# Patient Record
Sex: Female | Born: 1965 | Race: White | Hispanic: No | Marital: Married | Smoking: Never smoker
Health system: Southern US, Community
[De-identification: ages and names within clinical notes are randomized; demographics above are authoritative.]

## PROBLEM LIST (undated history)

## (undated) DIAGNOSIS — D242 Benign neoplasm of left breast: Secondary | ICD-10-CM

## (undated) DIAGNOSIS — Z98811 Dental restoration status: Secondary | ICD-10-CM

## (undated) HISTORY — PX: ENDOMETRIAL ABLATION W/ NOVASURE: SUR434

---

## 1987-09-19 HISTORY — PX: GYNECOLOGIC CRYOSURGERY: SHX857

## 1998-05-12 ENCOUNTER — Other Ambulatory Visit: Admission: RE | Admit: 1998-05-12 | Discharge: 1998-05-12 | Payer: Self-pay | Admitting: Obstetrics and Gynecology

## 1999-06-16 ENCOUNTER — Other Ambulatory Visit: Admission: RE | Admit: 1999-06-16 | Discharge: 1999-06-16 | Payer: Self-pay | Admitting: Obstetrics and Gynecology

## 1999-07-26 ENCOUNTER — Encounter: Payer: Self-pay | Admitting: Obstetrics and Gynecology

## 1999-07-26 ENCOUNTER — Ambulatory Visit (HOSPITAL_COMMUNITY): Admission: RE | Admit: 1999-07-26 | Discharge: 1999-07-26 | Payer: Self-pay | Admitting: Obstetrics and Gynecology

## 2000-09-27 ENCOUNTER — Other Ambulatory Visit: Admission: RE | Admit: 2000-09-27 | Discharge: 2000-09-27 | Payer: Self-pay | Admitting: Obstetrics and Gynecology

## 2001-10-28 ENCOUNTER — Other Ambulatory Visit: Admission: RE | Admit: 2001-10-28 | Discharge: 2001-10-28 | Payer: Self-pay | Admitting: Obstetrics and Gynecology

## 2003-07-21 ENCOUNTER — Other Ambulatory Visit: Admission: RE | Admit: 2003-07-21 | Discharge: 2003-07-21 | Payer: Self-pay | Admitting: Obstetrics and Gynecology

## 2004-07-27 ENCOUNTER — Other Ambulatory Visit: Admission: RE | Admit: 2004-07-27 | Discharge: 2004-07-27 | Payer: Self-pay | Admitting: Obstetrics and Gynecology

## 2004-08-16 ENCOUNTER — Ambulatory Visit (HOSPITAL_COMMUNITY): Admission: RE | Admit: 2004-08-16 | Discharge: 2004-08-16 | Payer: Self-pay | Admitting: Obstetrics and Gynecology

## 2005-06-21 ENCOUNTER — Other Ambulatory Visit: Admission: RE | Admit: 2005-06-21 | Discharge: 2005-06-21 | Payer: Self-pay | Admitting: Obstetrics and Gynecology

## 2005-06-29 ENCOUNTER — Ambulatory Visit (HOSPITAL_COMMUNITY): Admission: RE | Admit: 2005-06-29 | Discharge: 2005-06-29 | Payer: Self-pay | Admitting: Obstetrics and Gynecology

## 2005-06-29 ENCOUNTER — Encounter (INDEPENDENT_AMBULATORY_CARE_PROVIDER_SITE_OTHER): Payer: Self-pay | Admitting: *Deleted

## 2005-06-29 HISTORY — PX: COMBINED HYSTEROSCOPY DIAGNOSTIC / D&C: SUR297

## 2005-09-25 ENCOUNTER — Ambulatory Visit (HOSPITAL_COMMUNITY): Admission: RE | Admit: 2005-09-25 | Discharge: 2005-09-25 | Payer: Self-pay | Admitting: Obstetrics and Gynecology

## 2006-09-27 ENCOUNTER — Ambulatory Visit (HOSPITAL_COMMUNITY): Admission: RE | Admit: 2006-09-27 | Discharge: 2006-09-27 | Payer: Self-pay | Admitting: Obstetrics and Gynecology

## 2007-09-30 ENCOUNTER — Ambulatory Visit (HOSPITAL_COMMUNITY): Admission: RE | Admit: 2007-09-30 | Discharge: 2007-09-30 | Payer: Self-pay | Admitting: Obstetrics and Gynecology

## 2008-01-22 ENCOUNTER — Encounter: Admission: RE | Admit: 2008-01-22 | Discharge: 2008-01-22 | Payer: Self-pay | Admitting: Obstetrics and Gynecology

## 2008-10-07 ENCOUNTER — Ambulatory Visit (HOSPITAL_COMMUNITY): Admission: RE | Admit: 2008-10-07 | Discharge: 2008-10-07 | Payer: Self-pay | Admitting: Obstetrics and Gynecology

## 2009-10-20 ENCOUNTER — Ambulatory Visit (HOSPITAL_COMMUNITY): Admission: RE | Admit: 2009-10-20 | Discharge: 2009-10-20 | Payer: Self-pay | Admitting: Obstetrics and Gynecology

## 2009-10-28 ENCOUNTER — Encounter: Admission: RE | Admit: 2009-10-28 | Discharge: 2009-10-28 | Payer: Self-pay | Admitting: Obstetrics and Gynecology

## 2010-08-26 ENCOUNTER — Encounter
Admission: RE | Admit: 2010-08-26 | Discharge: 2010-08-26 | Payer: Self-pay | Source: Home / Self Care | Attending: Obstetrics and Gynecology | Admitting: Obstetrics and Gynecology

## 2010-10-06 ENCOUNTER — Other Ambulatory Visit (HOSPITAL_COMMUNITY): Payer: Self-pay | Admitting: Obstetrics and Gynecology

## 2010-10-06 DIAGNOSIS — Z0000 Encounter for general adult medical examination without abnormal findings: Secondary | ICD-10-CM

## 2010-10-26 ENCOUNTER — Ambulatory Visit (HOSPITAL_COMMUNITY)
Admission: RE | Admit: 2010-10-26 | Discharge: 2010-10-26 | Disposition: A | Discharge status: Home / Self Care | Payer: BC Managed Care – PPO | Source: Ambulatory Visit | Attending: Obstetrics and Gynecology | Admitting: Obstetrics and Gynecology

## 2010-10-26 ENCOUNTER — Ambulatory Visit (HOSPITAL_COMMUNITY): Admission: RE | Admit: 2010-10-26 | Payer: Self-pay | Source: Home / Self Care | Admitting: Obstetrics and Gynecology

## 2010-10-26 DIAGNOSIS — Z1231 Encounter for screening mammogram for malignant neoplasm of breast: Secondary | ICD-10-CM

## 2010-10-26 DIAGNOSIS — Z0000 Encounter for general adult medical examination without abnormal findings: Secondary | ICD-10-CM

## 2011-02-03 NOTE — Op Note (Signed)
Kelsey Greer, Kelsey Greer                  ACCOUNT NO.:  [id number]   MEDICAL RECORD NO.:  [id number]          PATIENT TYPE:  AMB   LOCATION:  SDC                           FACILITY:  [facility]   PHYSICIAN:  Hal Morales, M.D.DATE OF BIRTH:  Jun 03, 1966   DATE OF PROCEDURE:  06/29/2005  DATE OF DISCHARGE:                                 OPERATIVE REPORT   PREOPERATIVE DIAGNOSES:  1.  Irregular uterine bleeding.  2.  Endometrial mass on ultrasound.   POSTOPERATIVE DIAGNOSES:  1.  Irregular uterine bleeding.  2.  Endometrial mass on ultrasound.  3.  Probable endometrial polyps.   PROCEDURE:  Hysteroscopy, dilatation and curettage, and removal of  endometrial polyps.   SURGEON:  Hal Morales, M.D.   ANESTHESIA:  General orotracheal.   ESTIMATED BLOOD LOSS:  Less than 25 mL.   COMPLICATIONS:  None.   SPECIMEN:  Endometrial curettings containing polypoid tissue.   FINDINGS:  The uterus sounded to 8 cm.  At the time of hysteroscopy there  were numerous polypoid areas in the endometrial cavity and a fluffy  endometrium.  There were also some areas where the endometrium appeared to  be fairly atrophic.  The tubal ostia appeared normal on either side.   PROCEDURE:  The patient was taken to the operating room after appropriate  identification and placed on the operating table.  After the attainment of  adequate general anesthesia, she was placed in the lithotomy position.  The  perineum and vagina were prepped with multiple layers of Betadine and a red  Robinson catheter used to empty the bladder under sterile conditions.  The  perineum was draped as a sterile field.  A Graves speculum was placed in the  vagina and a paracervical block achieved with a total of 10 mL of 2%  Xylocaine in the 5 and 7 o'clock positions.  A tenaculum was placed on the  cervix and the uterus sounded.  The cervix was then dilated to a #23 dilator  and the hysteroscope was inserted with the above-noted  findings made and  documented.  The hysteroscope was removed and the Randall stone forceps used  to remove polypoid tissue.  Curetting of the uterus removed additional  copious amounts of tissue.  These were all removed from the operative field.  The hysteroscope was then used to ensure that the polypoid areas of tissue  had been removed from the uterus.  All instruments were removed from the  vagina.  A suture of 2-0 chromic was used to allow hemostasis on the cervix  at the site of the tenaculum, and at that time hemostasis was noted to be  adequate.  All instruments were removed from the vagina and the patient  awakened from  general anesthesia, taken to the recovery room in satisfactory condition  having tolerated the procedure well, with sponge and instrument counts  correct.   SPECIMENS TO PATHOLOGY:  Endometrial curettings containing polypoid tissue.      Hal Morales, M.D.  Electronically Signed     VPH/MEDQ  D:  06/29/2005  T:  06/29/2005  Job:  [id number]

## 2011-02-03 NOTE — H&P (Signed)
Kelsey Greer, Kelsey Greer             ACCOUNT NO.:  [id number]   MEDICAL RECORD NO.:  [id number]          PATIENT TYPE:  AMB   LOCATION:                                [facility]:  [facility]   PHYSICIAN:  Hal Morales, M.D.DATE OF BIRTH:  26-May-1966   DATE OF ADMISSION:  06/29/2005  DATE OF DISCHARGE:                                HISTORY & PHYSICAL   HISTORY OF PRESENT ILLNESS:  Ms. Egolf is a 45 year old married white female  para 3-0-0-3 who presents for hysteroscopy with D&C along with the resection  of a possible endometrial mass because of intramenstrual bleeding.  For the  past three months patient has stained her underwear with spotting that  occurred two weeks after her menstrual period and with exercise.  This  bleeding occurred without warning, lasted four days, and was accompanied by  very mild cramping.  A pelvic ultrasound in September of 2006 showed a  uterine size to be 9 x 5.2 x 5.7 cm with an endometrial mass measuring 1.76  x 0.69 x 1.5 cm.  Ovaries appeared normal on this study.  A hysteroscopy  with possible resection of the endometrial mass (polyp versus fibroid) was  recommended and described along with the risks.  Patient has chosen to  proceed with this procedure.   PAST MEDICAL HISTORY:   PAST OBSTETRICAL HISTORY:  Gravida 3, para 3-0-0-3.  Patient had spontaneous  vaginal deliveries in 1992, 1994, and 1996.   PAST GYNECOLOGICAL HISTORY:  Menarche 45 years old.  Patient's menstrual  periods have been regular until history of present illness.  She uses  vasectomy as her method of contraception.  Has a remote history of Chlamydia  (1998), HSV I, cryo surgery of her cervix in 1990.  Patient's last normal  Pap smear was November 2005 (Pap done June 21, 2005 is pending).   MEDICAL HISTORY:  Negative.   SURGICAL HISTORY:  Negative.  Patient denies any history of blood  transfusions.   FAMILY HISTORY:  Positive for cancer (brain, breast, cervical, and  lung).   SOCIAL HISTORY:  Patient is married and she works as a Engineer, technical sales.   HABITS:  Patient does not use tobacco.  She occasionally consumes alcohol.   CURRENT MEDICATIONS:  Tums one tablet daily.   Patient is allergic to PENICILLIN which causes a rash.   REVIEW OF SYSTEMS:  Patient does wear contact lenses, otherwise except as  mentioned in history of present illness.  Review of systems is negative.   PHYSICAL EXAMINATION:  VITAL SIGNS:  Blood pressure 100/62, weight 111.5  pounds, height 5 feet 3 inches tall.  NECK:  Supple without masses.  There is no thyromegaly or cervical  adenopathy.  HEART:  Regular rate and rhythm.  There is no murmur.  LUNGS:  Clear to auscultation.  There are no wheezes, rales, or rhonchi.  BACK:  No CVA tenderness.  ABDOMEN:  Bowel sounds are present.  It is soft without tenderness,  guarding, rebound, or organomegaly.  EXTREMITIES:  Without clubbing, cyanosis, edema.  NEUROLOGIC:  Grossly normal.  PELVIC:  EGBUS is within normal limits.  Vagina is rugose.  Cervix is  nontender without lesions.  Uterus appears normal size, shape, and  consistency without tenderness.  It is retroverted.  Adnexa without  tenderness or masses.  Rectovaginal examination without tenderness or  masses.   IMPRESSION:  1.  Irregular menstrual bleeding.  2.  Endometrial mass.   DISPOSITION:  A discussion was held with patient regarding the indications  for her procedures along with their risks which include, but are not limited  to, reaction to anesthesia, damage to adjacent organs, infection, and excess  bleeding. The risk of uterine perforation was likewise reviewed. A copy of  the ACOG brochure on hysteroscopy was given to patient.  She is scheduled to  undergo hysteroscopy with D&C and the possible resection of an endometrial  mass at [facility] [place] on October 12 at 11 a.m.      [place] J. Adline Peals.      Hal Morales, M.D.  Electronically  Signed    EJP/MEDQ  D:  06/21/2005  T:  06/21/2005  Job:  [id number]

## 2011-09-21 ENCOUNTER — Other Ambulatory Visit (HOSPITAL_COMMUNITY): Payer: Self-pay | Admitting: Obstetrics and Gynecology

## 2011-09-21 DIAGNOSIS — Z1231 Encounter for screening mammogram for malignant neoplasm of breast: Secondary | ICD-10-CM

## 2011-11-03 ENCOUNTER — Ambulatory Visit (HOSPITAL_COMMUNITY)
Admission: RE | Admit: 2011-11-03 | Discharge: 2011-11-03 | Disposition: A | Discharge status: Home / Self Care | Payer: BC Managed Care – PPO | Source: Ambulatory Visit | Attending: Obstetrics and Gynecology | Admitting: Obstetrics and Gynecology

## 2011-11-03 DIAGNOSIS — Z1231 Encounter for screening mammogram for malignant neoplasm of breast: Secondary | ICD-10-CM

## 2011-12-04 ENCOUNTER — Encounter (INDEPENDENT_AMBULATORY_CARE_PROVIDER_SITE_OTHER): Payer: BC Managed Care – PPO | Admitting: Obstetrics and Gynecology

## 2011-12-04 DIAGNOSIS — N939 Abnormal uterine and vaginal bleeding, unspecified: Secondary | ICD-10-CM

## 2011-12-04 DIAGNOSIS — N926 Irregular menstruation, unspecified: Secondary | ICD-10-CM

## 2011-12-04 DIAGNOSIS — N920 Excessive and frequent menstruation with regular cycle: Secondary | ICD-10-CM | POA: Insufficient documentation

## 2011-12-26 ENCOUNTER — Other Ambulatory Visit: Payer: Self-pay | Admitting: Obstetrics and Gynecology

## 2011-12-26 DIAGNOSIS — N926 Irregular menstruation, unspecified: Secondary | ICD-10-CM

## 2011-12-26 DIAGNOSIS — N920 Excessive and frequent menstruation with regular cycle: Secondary | ICD-10-CM

## 2011-12-27 ENCOUNTER — Ambulatory Visit (INDEPENDENT_AMBULATORY_CARE_PROVIDER_SITE_OTHER): Payer: BC Managed Care – PPO | Admitting: Obstetrics and Gynecology

## 2011-12-27 ENCOUNTER — Ambulatory Visit (INDEPENDENT_AMBULATORY_CARE_PROVIDER_SITE_OTHER): Payer: BC Managed Care – PPO

## 2011-12-27 VITALS — BP 102/68 | HR 76 | Temp 98.9°F | Resp 16 | Ht 64.0 in | Wt 112.0 lb

## 2011-12-27 DIAGNOSIS — N926 Irregular menstruation, unspecified: Secondary | ICD-10-CM

## 2011-12-27 DIAGNOSIS — N920 Excessive and frequent menstruation with regular cycle: Secondary | ICD-10-CM

## 2011-12-27 DIAGNOSIS — N939 Abnormal uterine and vaginal bleeding, unspecified: Secondary | ICD-10-CM

## 2011-12-29 DIAGNOSIS — N920 Excessive and frequent menstruation with regular cycle: Secondary | ICD-10-CM

## 2011-12-29 DIAGNOSIS — N926 Irregular menstruation, unspecified: Secondary | ICD-10-CM

## 2011-12-29 NOTE — Progress Notes (Signed)
Pt with menorrhagia for sono hysterogram  ULTRASOUND:  Retroverted uterus, normal endometrium, normal ovaries in size and shape, no fluid in CDS, no adnexal abnormalities seen SONOHYSTEROGRAM:  Saline infusion with 3D rendering:  No endometrial lesions seen                                             Endometrial measurements;  Width 3.4 cm                                                                                              Length 4.4 cm PROCEDURE:  With patient in lithotomy position, Graves speculum inserted.  Cervix prepped with Betadine.  SH catheter threaded through cervix into endometrial cavity.  20cc sterile saline infused as ultrasound with 3D rendering performed with the above noted findings.  All materials removed from the vagina.  Pt tolerated procedure well.  Pt had been offered endometrial ablation for treatment of menorrhagia.  The expected success rate of 85% of pts noting improved menses was reviewed.  Risks of anesthesia, bleeding, infection, damage to adjacent organs, uterine perforation and inability to execute the intended procedure were alll reviewed.  Pt wishes to schedule in office Novasure procedure under sedation, she understands she will be awake during the procedure.

## 2012-01-01 ENCOUNTER — Encounter: Payer: Self-pay | Admitting: Obstetrics and Gynecology

## 2012-01-01 MED ORDER — HYDROCODONE-ACETAMINOPHEN 7.5-750 MG PO TABS: ORAL_TABLET | ORAL | Status: DC

## 2012-01-01 MED ORDER — MISOPROSTOL 200 MCG PO TABS: 200.0000 ug | ORAL_TABLET | ORAL | Status: DC

## 2012-01-01 MED ORDER — LORAZEPAM 0.5 MG PO TABS: ORAL_TABLET | ORAL | Status: DC

## 2012-01-01 MED ORDER — IBUPROFEN 800 MG PO TABS: 800.0000 mg | ORAL_TABLET | Freq: Three times a day (TID) | ORAL | Status: AC | PRN

## 2012-01-01 MED ORDER — DOXYCYCLINE HYCLATE 100 MG PO TABS: 100.0000 mg | ORAL_TABLET | Freq: Two times a day (BID) | ORAL | Status: AC

## 2012-01-01 NOTE — Patient Instructions (Signed)
Printed Novasure instructions here for patient to pick up

## 2012-01-08 ENCOUNTER — Encounter: Payer: Self-pay | Admitting: Obstetrics and Gynecology

## 2012-01-30 ENCOUNTER — Telehealth: Payer: Self-pay | Admitting: Obstetrics and Gynecology

## 2012-01-30 NOTE — Telephone Encounter (Signed)
Tc to pt per telephone call. Pt with ?'s rgdg Novasure pre-op instructions for meds. Pre-op instructions reviewed with pt. Pt voices understanding.

## 2012-01-30 NOTE — Telephone Encounter (Signed)
chandra/epic 

## 2012-02-02 ENCOUNTER — Encounter (INDEPENDENT_AMBULATORY_CARE_PROVIDER_SITE_OTHER): Payer: BC Managed Care – PPO

## 2012-02-02 ENCOUNTER — Encounter: Payer: Self-pay | Admitting: Obstetrics and Gynecology

## 2012-02-02 ENCOUNTER — Ambulatory Visit (INDEPENDENT_AMBULATORY_CARE_PROVIDER_SITE_OTHER): Payer: BC Managed Care – PPO | Admitting: Obstetrics and Gynecology

## 2012-02-02 VITALS — BP 110/70 | HR 68 | Resp 12 | Ht 63.5 in

## 2012-02-02 DIAGNOSIS — IMO0002 Reserved for concepts with insufficient information to code with codable children: Secondary | ICD-10-CM

## 2012-02-02 DIAGNOSIS — N920 Excessive and frequent menstruation with regular cycle: Secondary | ICD-10-CM

## 2012-02-02 MED ORDER — KETOROLAC TROMETHAMINE 60 MG/2ML IM SOLN
60.0000 mg | Freq: Once | INTRAMUSCULAR | Status: AC
  Administered 2012-02-02: 60 mg via INTRAMUSCULAR

## 2012-02-02 NOTE — Progress Notes (Signed)
NovaSure Ablation Meds:  Meds used on 02-01-12 (Informed by pt) 1)Cytotec inserted at 6:30p and 11:00p 2)Ibuprofen taken 11:00pm 3)Doxycycline taken 11:00pm   Meds used on 02-02-12 (Informed by pt) 1)Doxycycline taken 9:00am 2)Vicodin taken 11:00am 3)Ativan taken 11:00am  Meds used in office on 02-02-12 1)Ativan taken 12:15p 2)Toradol injection given 12:24p by Melanee Left   Entered by cw,cma  HYSTEROSCOPY WITH NOVASURE ENDOMETRIAL ABLATION PROCEDURE    Patient's Name:   Kelsey Greer  Date of Service:   02/02/2012   Preoperative Diagnosis: Menorrhagia  Postoperative Diagnosis: same  Name of Operation/Procedure: Novasure endometrial ablation  EBL: Minimal  JJO:ACZYSA prior to procedure   Procedure: The patient was placed in the dorsal lithotomy position.  She was draped.  A bimanual exam was performed.  A Grave's speculum was placed in the vaginal vault.  The anterior lip of the cervix was grasped with a single tooth tenaculum.  Using 1% Lidocaine (15 cc) and 0.25%Bupivicaine (5 cc), a paracervical block was placed in the sacrouterine pillars near the right and left parametrial areas.  Additionally, injection sites were placed circumferentially around the ectocervix: approximately 15 cc was used. Minimal dilation was required to reach the width of the Novasure apparatus.  A uterine dilator to 8 [place] was then used to dilated the internal cervical os.     The uterus was sounded to 8cm.  The uterine cavity length measured  5  cm and set on the Novasure handpiece.  The device was then inserted transcervically and the cavity width measured 3.5 cm as indicated by the cavity width dial on the Novasure handpiece.  The Cavity Integrity Assessment safety feature was initiated and passed.  The delivery of Radio Frequency energy was delivered for a total of  107 seconds of treatment time at a power of 94  watts.  The device was retracted and safely removed from the cavity.  The  instruments were removed.  Sponge and needle counts were correct at completion of the procedure.  The patient was recovered and dismissed later.  She tolerated the procedure well.  Saveah Bahar P  MD 5/17/20131:19 PM    RECOVERY BP: 122/80 Pulse 64 RR 16 Time 1:44 Pm BP:110/72 Pulse 72 RR 16 Time 2:08 Pm BP:104/72 Pulse 72 RR 17 Time 2:42 Pm  Pt was Discharged at 2:42 Pm

## 2012-02-02 NOTE — Patient Instructions (Signed)
Written post op instructions for Endometrial ablation

## 2012-02-14 ENCOUNTER — Encounter: Payer: Self-pay | Admitting: Obstetrics and Gynecology

## 2012-02-14 ENCOUNTER — Ambulatory Visit (INDEPENDENT_AMBULATORY_CARE_PROVIDER_SITE_OTHER): Payer: BC Managed Care – PPO | Admitting: Obstetrics and Gynecology

## 2012-02-14 VITALS — BP 102/60 | Temp 98.5°F | Ht 63.5 in | Wt 114.0 lb

## 2012-02-14 DIAGNOSIS — N920 Excessive and frequent menstruation with regular cycle: Secondary | ICD-10-CM

## 2012-02-14 NOTE — Patient Instructions (Signed)
May use tampons prn and resume IC

## 2012-02-14 NOTE — Progress Notes (Signed)
Surgery: Novasure Date: 02/02/2012  Eating a regular diet without difficulty. Bowel movements are normal.  The patient is not having any pain.  Bladder function is returned to normal. Vaginal bleeding: scant staining Vaginal discharge: color clear   BP 102/60  Temp(Src) 98.5 F (36.9 C) (Oral)  Ht 5' 3.5" (1.613 m)  Wt 114 lb (51.71 kg)  BMI 19.88 kg/m2  LMP 12/15/2011  Pelvic: Pelvic exam:,  VULVA: normal appearing vulva with no masses, tenderness or lesions,  VAGINA: normal appearing vagina with normal color and discharge, no lesions,  CERVIX: normal appearing cervix without discharge or lesions, cervical motion tenderness absent,  UTERUS: uterus is upper limits normal size, shape, consistency and nontender, posterior and mobile,  ADNEXA: normal adnexa in size, nontender and no masses.   Assessment: Nl post op course  Recommendation: Resume all nl activities RTO 3mos for aex.

## 2012-02-15 ENCOUNTER — Encounter: Payer: BC Managed Care – PPO | Admitting: Obstetrics and Gynecology

## 2012-09-27 ENCOUNTER — Other Ambulatory Visit: Payer: Self-pay | Admitting: Obstetrics and Gynecology

## 2012-09-27 DIAGNOSIS — Z1231 Encounter for screening mammogram for malignant neoplasm of breast: Secondary | ICD-10-CM

## 2012-11-06 ENCOUNTER — Ambulatory Visit (HOSPITAL_COMMUNITY)
Admission: RE | Admit: 2012-11-06 | Discharge: 2012-11-06 | Disposition: A | Discharge status: Home / Self Care | Payer: BC Managed Care – PPO | Source: Ambulatory Visit | Attending: Obstetrics and Gynecology | Admitting: Obstetrics and Gynecology

## 2012-11-06 DIAGNOSIS — Z1231 Encounter for screening mammogram for malignant neoplasm of breast: Secondary | ICD-10-CM

## 2012-11-07 ENCOUNTER — Other Ambulatory Visit: Payer: Self-pay | Admitting: Obstetrics and Gynecology

## 2012-11-07 DIAGNOSIS — R928 Other abnormal and inconclusive findings on diagnostic imaging of breast: Secondary | ICD-10-CM

## 2012-11-14 ENCOUNTER — Encounter: Payer: Self-pay | Admitting: Obstetrics and Gynecology

## 2012-11-14 DIAGNOSIS — R922 Inconclusive mammogram: Secondary | ICD-10-CM | POA: Insufficient documentation

## 2012-11-21 ENCOUNTER — Ambulatory Visit
Admission: RE | Admit: 2012-11-21 | Discharge: 2012-11-21 | Disposition: A | Discharge status: Home / Self Care | Payer: BC Managed Care – PPO | Source: Ambulatory Visit | Attending: Obstetrics and Gynecology | Admitting: Obstetrics and Gynecology

## 2012-11-21 DIAGNOSIS — R928 Other abnormal and inconclusive findings on diagnostic imaging of breast: Secondary | ICD-10-CM

## 2012-11-24 ENCOUNTER — Encounter: Payer: Self-pay | Admitting: Obstetrics and Gynecology

## 2013-09-18 HISTORY — PX: BREAST EXCISIONAL BIOPSY: SUR124

## 2013-10-20 ENCOUNTER — Other Ambulatory Visit: Payer: Self-pay | Admitting: Obstetrics and Gynecology

## 2013-10-20 DIAGNOSIS — Z1231 Encounter for screening mammogram for malignant neoplasm of breast: Secondary | ICD-10-CM

## 2013-11-24 ENCOUNTER — Ambulatory Visit (HOSPITAL_COMMUNITY)
Admission: RE | Admit: 2013-11-24 | Discharge: 2013-11-24 | Disposition: A | Discharge status: Home / Self Care | Payer: BC Managed Care – PPO | Source: Ambulatory Visit | Attending: Obstetrics and Gynecology | Admitting: Obstetrics and Gynecology

## 2013-11-24 DIAGNOSIS — Z1231 Encounter for screening mammogram for malignant neoplasm of breast: Secondary | ICD-10-CM

## 2014-05-19 DIAGNOSIS — D242 Benign neoplasm of left breast: Secondary | ICD-10-CM

## 2014-05-19 HISTORY — DX: Benign neoplasm of left breast: D24.2

## 2014-05-28 ENCOUNTER — Other Ambulatory Visit: Payer: Self-pay | Admitting: Obstetrics and Gynecology

## 2014-05-28 DIAGNOSIS — N6452 Nipple discharge: Secondary | ICD-10-CM

## 2014-05-28 DIAGNOSIS — N63 Unspecified lump in unspecified breast: Secondary | ICD-10-CM

## 2014-05-29 ENCOUNTER — Other Ambulatory Visit: Payer: Self-pay | Admitting: Obstetrics and Gynecology

## 2014-05-29 ENCOUNTER — Ambulatory Visit
Admission: RE | Admit: 2014-05-29 | Discharge: 2014-05-29 | Disposition: A | Discharge status: Home / Self Care | Payer: BC Managed Care – PPO | Source: Ambulatory Visit | Attending: Obstetrics and Gynecology | Admitting: Obstetrics and Gynecology

## 2014-05-29 DIAGNOSIS — N6320 Unspecified lump in the left breast, unspecified quadrant: Secondary | ICD-10-CM

## 2014-05-29 DIAGNOSIS — N6452 Nipple discharge: Secondary | ICD-10-CM

## 2014-05-29 DIAGNOSIS — N63 Unspecified lump in unspecified breast: Secondary | ICD-10-CM

## 2014-06-03 ENCOUNTER — Telehealth (INDEPENDENT_AMBULATORY_CARE_PROVIDER_SITE_OTHER): Payer: Self-pay

## 2014-06-03 ENCOUNTER — Ambulatory Visit
Admission: RE | Admit: 2014-06-03 | Discharge: 2014-06-03 | Disposition: A | Discharge status: Home / Self Care | Payer: BC Managed Care – PPO | Source: Ambulatory Visit | Attending: Obstetrics and Gynecology | Admitting: Obstetrics and Gynecology

## 2014-06-03 ENCOUNTER — Other Ambulatory Visit: Payer: Self-pay | Admitting: Obstetrics and Gynecology

## 2014-06-03 DIAGNOSIS — N6320 Unspecified lump in the left breast, unspecified quadrant: Secondary | ICD-10-CM

## 2014-06-03 NOTE — Telephone Encounter (Signed)
LMOM on cell and home to call me. I left my name and number for her to call if she wants to before the bx appt today.

## 2014-06-03 NOTE — Telephone Encounter (Signed)
Pt returned call. Pt is feels much better now knowing that Dr Donne Hazel did get the image results and that he knows that she is getting her bx today. I reassured her that we will get her in for appt if needed ASAP after the bx results come back. Pt understands.

## 2014-06-08 NOTE — Telephone Encounter (Signed)
Pt returned my call. Pt will see [place] this week on 9/23 arrive at 10:00/10:30 with Dr Donne Hazel.

## 2014-06-10 ENCOUNTER — Other Ambulatory Visit (INDEPENDENT_AMBULATORY_CARE_PROVIDER_SITE_OTHER): Payer: Self-pay | Admitting: General Surgery

## 2014-06-10 DIAGNOSIS — D242 Benign neoplasm of left breast: Secondary | ICD-10-CM

## 2014-06-10 DIAGNOSIS — D249 Benign neoplasm of unspecified breast: Secondary | ICD-10-CM | POA: Insufficient documentation

## 2014-06-12 ENCOUNTER — Encounter (HOSPITAL_BASED_OUTPATIENT_CLINIC_OR_DEPARTMENT_OTHER): Payer: Self-pay | Admitting: *Deleted

## 2014-06-12 NOTE — Pre-Procedure Instructions (Signed)
To come for CBC, diff, BMET 

## 2014-06-16 ENCOUNTER — Encounter (HOSPITAL_BASED_OUTPATIENT_CLINIC_OR_DEPARTMENT_OTHER): Admission: RE | Admit: 2014-06-16 | Payer: BC Managed Care – PPO | Source: Ambulatory Visit

## 2014-06-16 ENCOUNTER — Encounter (HOSPITAL_BASED_OUTPATIENT_CLINIC_OR_DEPARTMENT_OTHER)
Admission: RE | Admit: 2014-06-16 | Discharge: 2014-06-16 | Disposition: A | Discharge status: Home / Self Care | Payer: BC Managed Care – PPO | Source: Ambulatory Visit | Attending: General Surgery | Admitting: General Surgery

## 2014-06-16 ENCOUNTER — Ambulatory Visit
Admission: RE | Admit: 2014-06-16 | Discharge: 2014-06-16 | Disposition: A | Discharge status: Home / Self Care | Payer: BC Managed Care – PPO | Source: Ambulatory Visit | Attending: General Surgery | Admitting: General Surgery

## 2014-06-16 DIAGNOSIS — D242 Benign neoplasm of left breast: Secondary | ICD-10-CM

## 2014-06-16 DIAGNOSIS — Z880 Allergy status to penicillin: Secondary | ICD-10-CM | POA: Diagnosis not present

## 2014-06-16 DIAGNOSIS — N6459 Other signs and symptoms in breast: Secondary | ICD-10-CM | POA: Diagnosis present

## 2014-06-16 DIAGNOSIS — D249 Benign neoplasm of unspecified breast: Secondary | ICD-10-CM | POA: Diagnosis not present

## 2014-06-16 LAB — CBC WITH DIFFERENTIAL/PLATELET
BASOS ABS: 0 10*3/uL (ref 0.0–0.1)
Basophils Relative: 0 % (ref 0–1)
EOS ABS: 0 10*3/uL (ref 0.0–0.7)
Eosinophils Relative: 0 % (ref 0–5)
HCT: 40.4 % (ref 36.0–46.0)
Hemoglobin: 13.9 g/dL (ref 12.0–15.0)
Lymphocytes Relative: 35 % (ref 12–46)
Lymphs Abs: 3.3 10*3/uL (ref 0.7–4.0)
MCH: 32.6 pg (ref 26.0–34.0)
MCHC: 34.4 g/dL (ref 30.0–36.0)
MCV: 94.8 fL (ref 78.0–100.0)
Monocytes Absolute: 0.5 10*3/uL (ref 0.1–1.0)
Monocytes Relative: 5 % (ref 3–12)
NEUTROS PCT: 60 % (ref 43–77)
Neutro Abs: 5.5 10*3/uL (ref 1.7–7.7)
PLATELETS: 277 10*3/uL (ref 150–400)
RBC: 4.26 MIL/uL (ref 3.87–5.11)
RDW: 12.1 % (ref 11.5–15.5)
WBC: 9.4 10*3/uL (ref 4.0–10.5)

## 2014-06-16 LAB — BASIC METABOLIC PANEL
Anion gap: 12 (ref 5–15)
BUN: 6 mg/dL (ref 6–23)
CALCIUM: 9.2 mg/dL (ref 8.4–10.5)
CO2: 28 mEq/L (ref 19–32)
Chloride: 100 mEq/L (ref 96–112)
Creatinine, Ser: 0.68 mg/dL (ref 0.50–1.10)
GLUCOSE: 78 mg/dL (ref 70–99)
POTASSIUM: 3.9 meq/L (ref 3.7–5.3)
Sodium: 140 mEq/L (ref 137–147)

## 2014-06-17 ENCOUNTER — Encounter (HOSPITAL_BASED_OUTPATIENT_CLINIC_OR_DEPARTMENT_OTHER): Payer: BC Managed Care – PPO | Admitting: Anesthesiology

## 2014-06-17 ENCOUNTER — Encounter (HOSPITAL_BASED_OUTPATIENT_CLINIC_OR_DEPARTMENT_OTHER): Payer: Self-pay | Admitting: *Deleted

## 2014-06-17 ENCOUNTER — Ambulatory Visit
Admission: RE | Admit: 2014-06-17 | Discharge: 2014-06-17 | Disposition: A | Discharge status: Home / Self Care | Payer: BC Managed Care – PPO | Source: Ambulatory Visit | Attending: General Surgery | Admitting: General Surgery

## 2014-06-17 ENCOUNTER — Encounter (HOSPITAL_BASED_OUTPATIENT_CLINIC_OR_DEPARTMENT_OTHER)
Admission: RE | Disposition: A | Discharge status: Home / Self Care | Payer: Self-pay | Source: Ambulatory Visit | Attending: General Surgery

## 2014-06-17 ENCOUNTER — Ambulatory Visit (HOSPITAL_BASED_OUTPATIENT_CLINIC_OR_DEPARTMENT_OTHER): Payer: BC Managed Care – PPO | Admitting: Anesthesiology

## 2014-06-17 ENCOUNTER — Ambulatory Visit (HOSPITAL_BASED_OUTPATIENT_CLINIC_OR_DEPARTMENT_OTHER)
Admission: RE | Admit: 2014-06-17 | Discharge: 2014-06-17 | Disposition: A | Discharge status: Home / Self Care | Payer: BC Managed Care – PPO | Source: Ambulatory Visit | Attending: General Surgery | Admitting: General Surgery

## 2014-06-17 DIAGNOSIS — D249 Benign neoplasm of unspecified breast: Secondary | ICD-10-CM | POA: Diagnosis not present

## 2014-06-17 DIAGNOSIS — Z880 Allergy status to penicillin: Secondary | ICD-10-CM | POA: Insufficient documentation

## 2014-06-17 DIAGNOSIS — D242 Benign neoplasm of left breast: Secondary | ICD-10-CM

## 2014-06-17 HISTORY — DX: Benign neoplasm of left breast: D24.2

## 2014-06-17 HISTORY — DX: Dental restoration status: Z98.811

## 2014-06-17 SURGERY — RADIOACTIVE SEED GUIDED BREAST BIOPSY
Anesthesia: General | Laterality: Left

## 2014-06-17 MED ORDER — OXYCODONE HCL 5 MG/5ML PO SOLN: 5.0000 mg | Freq: Once | ORAL | Status: DC | PRN

## 2014-06-17 MED ORDER — DEXAMETHASONE SODIUM PHOSPHATE 4 MG/ML IJ SOLN
INTRAMUSCULAR | Status: DC | PRN
  Administered 2014-06-17: 10 mg via INTRAVENOUS

## 2014-06-17 MED ORDER — OXYCODONE HCL 5 MG PO TABS: 5.0000 mg | ORAL_TABLET | Freq: Once | ORAL | Status: DC | PRN

## 2014-06-17 MED ORDER — HYDROMORPHONE HCL 1 MG/ML IJ SOLN: 0.2500 mg | INTRAMUSCULAR | Status: DC | PRN

## 2014-06-17 MED ORDER — MIDAZOLAM HCL 2 MG/2ML IJ SOLN
INTRAMUSCULAR | Status: AC
  Filled 2014-06-17: qty 2

## 2014-06-17 MED ORDER — PROMETHAZINE HCL 25 MG/ML IJ SOLN: 6.2500 mg | INTRAMUSCULAR | Status: DC | PRN

## 2014-06-17 MED ORDER — OXYCODONE-ACETAMINOPHEN 10-325 MG PO TABS: 1.0000 | ORAL_TABLET | Freq: Four times a day (QID) | ORAL | Status: AC | PRN

## 2014-06-17 MED ORDER — MIDAZOLAM HCL 5 MG/5ML IJ SOLN
INTRAMUSCULAR | Status: DC | PRN
  Administered 2014-06-17: 2 mg via INTRAVENOUS

## 2014-06-17 MED ORDER — FENTANYL CITRATE 0.05 MG/ML IJ SOLN
INTRAMUSCULAR | Status: AC
  Filled 2014-06-17: qty 6

## 2014-06-17 MED ORDER — MIDAZOLAM HCL 2 MG/2ML IJ SOLN
INTRAMUSCULAR | Status: AC
Start: 2014-06-17 — End: 2014-06-17
  Filled 2014-06-17: qty 2

## 2014-06-17 MED ORDER — MIDAZOLAM HCL 2 MG/ML PO SYRP: 12.0000 mg | ORAL_SOLUTION | Freq: Once | ORAL | Status: DC | PRN

## 2014-06-17 MED ORDER — LIDOCAINE HCL (CARDIAC) 20 MG/ML IV SOLN
INTRAVENOUS | Status: DC | PRN
  Administered 2014-06-17: 50 mg via INTRAVENOUS

## 2014-06-17 MED ORDER — FENTANYL CITRATE 0.05 MG/ML IJ SOLN: 50.0000 ug | INTRAMUSCULAR | Status: DC | PRN

## 2014-06-17 MED ORDER — FENTANYL CITRATE 0.05 MG/ML IJ SOLN
INTRAMUSCULAR | Status: DC | PRN
  Administered 2014-06-17: 100 ug via INTRAVENOUS

## 2014-06-17 MED ORDER — MIDAZOLAM HCL 2 MG/2ML IJ SOLN: 1.0000 mg | INTRAMUSCULAR | Status: DC | PRN

## 2014-06-17 MED ORDER — BUPIVACAINE HCL (PF) 0.25 % IJ SOLN
INTRAMUSCULAR | Status: DC | PRN
  Administered 2014-06-17: 10 mL

## 2014-06-17 MED ORDER — ONDANSETRON HCL 4 MG/2ML IJ SOLN
INTRAMUSCULAR | Status: DC | PRN
  Administered 2014-06-17: 4 mg via INTRAVENOUS

## 2014-06-17 MED ORDER — LACTATED RINGERS IV SOLN
INTRAVENOUS | Status: DC
  Administered 2014-06-17: 13:00:00 via INTRAVENOUS

## 2014-06-17 MED ORDER — PROPOFOL 10 MG/ML IV BOLUS
INTRAVENOUS | Status: DC | PRN
  Administered 2014-06-17: 150 mg via INTRAVENOUS

## 2014-06-17 SURGICAL SUPPLY — 58 items
ADH SKN CLS APL DERMABOND .7 (GAUZE/BANDAGES/DRESSINGS) ×1
APL SKNCLS STERI-STRIP NONHPOA (GAUZE/BANDAGES/DRESSINGS)
APPLIER CLIP 9.375 MED OPEN (MISCELLANEOUS)
APR CLP MED 9.3 20 MLT OPN (MISCELLANEOUS)
BENZOIN TINCTURE PRP APPL 2/3 (GAUZE/BANDAGES/DRESSINGS) IMPLANT
BINDER BREAST LRG (GAUZE/BANDAGES/DRESSINGS) IMPLANT
BINDER BREAST MEDIUM (GAUZE/BANDAGES/DRESSINGS) ×1 IMPLANT
BINDER BREAST XLRG (GAUZE/BANDAGES/DRESSINGS) IMPLANT
BINDER BREAST XXLRG (GAUZE/BANDAGES/DRESSINGS) IMPLANT
BLADE SURG 15 STRL LF DISP TIS (BLADE) ×1 IMPLANT
BLADE SURG 15 STRL SS (BLADE) ×2
CANISTER SUC SOCK COL 7IN (MISCELLANEOUS) IMPLANT
CANISTER SUCT 1200ML W/VALVE (MISCELLANEOUS) IMPLANT
CHLORAPREP W/TINT 26ML (MISCELLANEOUS) ×2 IMPLANT
CLIP APPLIE 9.375 MED OPEN (MISCELLANEOUS) IMPLANT
COVER MAYO STAND STRL (DRAPES) ×2 IMPLANT
COVER PROBE W GEL 5X96 (DRAPES) ×2 IMPLANT
COVER TABLE BACK 60X90 (DRAPES) ×2 IMPLANT
DECANTER SPIKE VIAL GLASS SM (MISCELLANEOUS) IMPLANT
DERMABOND ADVANCED (GAUZE/BANDAGES/DRESSINGS) ×1
DERMABOND ADVANCED .7 DNX12 (GAUZE/BANDAGES/DRESSINGS) ×1 IMPLANT
DEVICE DUBIN W/COMP PLATE 8390 (MISCELLANEOUS) ×2 IMPLANT
DRAPE PED LAPAROTOMY (DRAPES) ×2 IMPLANT
DRSG TEGADERM 4X4.75 (GAUZE/BANDAGES/DRESSINGS) IMPLANT
ELECT COATED BLADE 2.86 ST (ELECTRODE) ×2 IMPLANT
ELECT REM PT RETURN 9FT ADLT (ELECTROSURGICAL) ×2
ELECTRODE REM PT RTRN 9FT ADLT (ELECTROSURGICAL) ×1 IMPLANT
GLOVE BIO SURGEON STRL SZ7 (GLOVE) ×4 IMPLANT
GLOVE BIOGEL PI IND STRL 7.5 (GLOVE) ×1 IMPLANT
GLOVE BIOGEL PI INDICATOR 7.5 (GLOVE) ×1
GLOVE SURG SS PI 6.5 STRL IVOR (GLOVE) ×2 IMPLANT
GOWN STRL REUS W/ TWL LRG LVL3 (GOWN DISPOSABLE) ×2 IMPLANT
GOWN STRL REUS W/TWL LRG LVL3 (GOWN DISPOSABLE) ×4
KIT MARKER MARGIN INK (KITS) ×2 IMPLANT
NDL HYPO 25X1 1.5 SAFETY (NEEDLE) ×1 IMPLANT
NEEDLE HYPO 25X1 1.5 SAFETY (NEEDLE) ×2 IMPLANT
NS IRRIG 1000ML POUR BTL (IV SOLUTION) ×2 IMPLANT
PACK BASIN DAY SURGERY FS (CUSTOM PROCEDURE TRAY) ×2 IMPLANT
PENCIL BUTTON HOLSTER BLD 10FT (ELECTRODE) ×2 IMPLANT
SHEET MEDIUM DRAPE 40X70 STRL (DRAPES) IMPLANT
SLEEVE SCD COMPRESS KNEE MED (MISCELLANEOUS) ×2 IMPLANT
SPONGE GAUZE 4X4 12PLY STER LF (GAUZE/BANDAGES/DRESSINGS) ×1 IMPLANT
SPONGE LAP 4X18 X RAY DECT (DISPOSABLE) ×2 IMPLANT
STAPLER VISISTAT 35W (STAPLE) IMPLANT
STRIP CLOSURE SKIN 1/2X4 (GAUZE/BANDAGES/DRESSINGS) ×2 IMPLANT
SUT MNCRL AB 4-0 PS2 18 (SUTURE) IMPLANT
SUT MON AB 5-0 PS2 18 (SUTURE) ×1 IMPLANT
SUT SILK 2 0 SH (SUTURE) IMPLANT
SUT VIC AB 2-0 SH 27 (SUTURE) ×2
SUT VIC AB 2-0 SH 27XBRD (SUTURE) ×1 IMPLANT
SUT VIC AB 3-0 SH 27 (SUTURE) ×2
SUT VIC AB 3-0 SH 27X BRD (SUTURE) ×1 IMPLANT
SUT VIC AB 5-0 PS2 18 (SUTURE) IMPLANT
SYR CONTROL 10ML LL (SYRINGE) ×2 IMPLANT
TOWEL OR 17X24 6PK STRL BLUE (TOWEL DISPOSABLE) ×2 IMPLANT
TOWEL OR NON WOVEN STRL DISP B (DISPOSABLE) ×2 IMPLANT
TUBE CONNECTING 20X1/4 (TUBING) IMPLANT
YANKAUER SUCT BULB TIP NO VENT (SUCTIONS) IMPLANT

## 2014-06-17 NOTE — Anesthesia Postprocedure Evaluation (Signed)
  Anesthesia Post-op Note  Patient: Kelsey Greer  Procedure(s) Performed: Procedure(s): RADIOACTIVE SEED GUIDED EXCISIONAL BREAST BIOPSY (Left)  Patient Location: PACU  Anesthesia Type:General  Level of Consciousness: awake and alert   Airway and Oxygen Therapy: Patient Spontanous Breathing  Post-op Pain: mild  Post-op Assessment: Post-op Vital signs reviewed  Post-op Vital Signs: stable  Last Vitals:  Filed Vitals:   06/17/14 1600  BP: 111/40  Pulse: 83  Temp: 36.1 C  Resp: 16    Complications: No apparent anesthesia complications

## 2014-06-17 NOTE — Anesthesia Procedure Notes (Signed)
Procedure Name: LMA Insertion Date/Time: 06/17/2014 2:32 PM Performed by: Melynda Ripple D Pre-anesthesia Checklist: Patient identified, Emergency Drugs available, Suction available and Patient being monitored Patient Re-evaluated:Patient Re-evaluated prior to inductionOxygen Delivery Method: Circle System Utilized Preoxygenation: Pre-oxygenation with 100% oxygen Intubation Type: IV induction Ventilation: Mask ventilation without difficulty LMA: LMA inserted LMA Size: 3.0 Number of attempts: 1 Airway Equipment and Method: bite block Placement Confirmation: positive ETCO2 Tube secured with: Tape Dental Injury: Teeth and Oropharynx as per pre-operative assessment

## 2014-06-17 NOTE — H&P (Signed)
atient words: breast papilloma.  The patient is a 48 year old female who presents with a breast mass. 48 yo healthy female with no prior breast problems who presents after noticing on Labor Day weekend that she was having clear/yellow spontaneous unilateral left breast discharge. this has not been bloody. she has not had dc in a couple weeks. She was seen by Dr Leo Grosser and referred for radiologic evaluation. Mrs. Askin has dense breasts and a difficult self exam. She did not note any change in sbe. she underwent evaluation that showed a breast density of D and normal mm. US shows duct ectasia in the left UOQ spanning the 12-3 oclock location with a focally dilated branching duct in the 2 oclock location 2 cm from nipple with a 2 cm in length area of intraductal filling defect. 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 she comes in today to discuss her options   Other Problems Ventura Sellers, CMA; 06/10/2014 11:04 AM) No pertinent past medical history  Past Surgical History Ventura Sellers, [place]; 06/10/2014 11:04 AM) Breast Biopsy Left. Oral Surgery  Diagnostic Studies History Ventura Sellers, [place]; 06/10/2014 11:04 AM) Colonoscopy never Mammogram within last year Pap Smear 1-5 years ago  Allergies Ventura Sellers, CMA; 06/10/2014 11:06 AM) Azithromycin *CHEMICALS* Penicillin V Potassium *PENICILLINS* Etodolac *ANALGESICS - ANTI-INFLAMMATORY*  Medication History Ventura Sellers, CMA; 06/10/2014 11:06 AM) No Current Medications  Social History Ventura Sellers, [place]; 06/10/2014 11:04 AM) Alcohol use Moderate alcohol use. Caffeine use Coffee. No drug use Tobacco use Never smoker.  Family History Ventura Sellers, [place]; 06/10/2014 11:04 AM) Cancer Mother. Hypertension Father. Respiratory Condition Mother.  Pregnancy / Birth History Ventura Sellers, [place]; 06/10/2014 11:04 AM) Age at menarche 8  years. Contraceptive History Oral contraceptives. Gravida 3 Irregular periods Maternal age 88-30 Para 3  Review of Systems Sharyn Lull R. Brooks CMA; 06/10/2014 11:04 AM) General Not Present- Appetite Loss, Chills, Fatigue, Fever, Night Sweats, Weight Gain and Weight Loss. Skin Not Present- Change in Wart/Mole, Dryness, Hives, Jaundice, New Lesions, Non-Healing Wounds, Rash and Ulcer. HEENT Present- Wears glasses/contact lenses. Not Present- Earache, Hearing Loss, Hoarseness, Nose Bleed, Oral Ulcers, Ringing in the Ears, Seasonal Allergies, Sinus Pain, Sore Throat, Visual Disturbances and Yellow Eyes. Respiratory Not Present- Bloody sputum, Chronic Cough, Difficulty Breathing, Snoring and Wheezing. Breast Present- Breast Mass and Nipple Discharge. Not Present- Breast Pain and Skin Changes. Cardiovascular Not Present- Chest Pain, Difficulty Breathing Lying Down, Leg Cramps, Palpitations, Rapid Heart Rate, Shortness of Breath and Swelling of Extremities. Gastrointestinal Not Present- Abdominal Pain, Bloating, Bloody Stool, Change in Bowel Habits, Chronic diarrhea, Constipation, Difficulty Swallowing, Excessive gas, Gets full quickly at meals, Hemorrhoids, Indigestion, Nausea, Rectal Pain and Vomiting. Female Genitourinary Not Present- Frequency, Nocturia, Painful Urination, Pelvic Pain and Urgency. Musculoskeletal Not Present- Back Pain, Joint Pain, Joint Stiffness, Muscle Pain, Muscle Weakness and Swelling of Extremities. Neurological Not Present- Decreased Memory, Fainting, Headaches, Numbness, Seizures, Tingling, Tremor, Trouble walking and Weakness. Psychiatric Not Present- Anxiety, Bipolar, Change in Sleep Pattern, Depression, Fearful and Frequent crying. Endocrine Not Present- Cold Intolerance, Excessive Hunger, Hair Changes, Heat Intolerance, Hot flashes and New Diabetes. Hematology Not Present- Easy Bruising, Excessive bleeding, Gland problems, HIV and Persistent Infections.   Vitals  [employer] R. Brooks CMA; 06/10/2014 11:04 AM) 06/10/2014 11:04 AM Weight: 110.38 lb Height: 63.5in Body Surface Area: 1.5 m Body Mass Index: 19.25 kg/m Pulse: 72 (Regular)  Resp.: 12 (Unlabored)  BP: 106/68 (Sitting, Left  Arm, Standard)    Physical Exam Rolm Bookbinder MD; 06/10/2014 2:51 PM) General Mental Status-Alert. Orientation-Oriented X3.  Chest and Lung Exam Chest and lung exam reveals -normal excursion with symmetric chest walls, quiet, even and easy respiratory effort with no use of accessory muscles and on auscultation, normal breath sounds, no adventitious sounds and normal vocal resonance.  Breast Nipples Discharge - Bilateral - None. Breast - Bilateral-Normal. Note: dense breast tissue but no discrete mass Breast Lump-No Palpable Breast Mass.  Lymphatic Head & Neck  General Head & Neck Lymphatics: Bilateral - Description - Normal. Axillary  General Axillary Region: Bilateral - Description - Normal. 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 She did have discharge though and there certainly is possibility there is atypia or even dcis present in the larger area. we have both decided to proceed with left breast radioactive seed guided excision. risks/benefits were discussed. postop recovery discussed. we also discussed dense breasts and will await final path before we decide how to follow her moving forward. will schedule for next week

## 2014-06-17 NOTE — Op Note (Signed)
Preoperative diagnosis: Left nipple discharge with papilloma on core biopsy Postoperative diagnosis: Same as above Procedure: Left breast radioactive seed guided excisional biopsy  Surgeon: Dr. Serita Grammes  Anesthesia: Gen. With LMA Estimated blood loss: Minimal Specimens: Left breast tissue marked with paint Complications: None Drains: None Sponge needle count was correct the completion Disposition to recovery stable  Indications: This a 48 year old female who's had some spontaneous left nipple discharge. She underwent an evaluation that showed about a 2 cm intraductal filling defect associated with this. This is been biopsied and is a papilloma. We discussed due to her nipple discharge as well as this biopsy excising the area.  Procedure: The patient first had a radioactive seed placed at the breast center. I had mammograms available in the operating room. She was then brought to [facility]. After informed consent was obtained she was then taken to the operating room. Sequential compression devices were on her legs. She was then placed under general anesthesia without complications. A surgical timeout was performed.  I located the seed with the neoprobe. I then made a periareolar incision. I then was able to identify the duct that was discharging and passed a lacrimal duct probe. I identified this probe to make sure I removed this ductal system. I then used the neoprobe to guide excision of the seed and the surrounding ductal system. This was then passed off the table. The seed was confirmed to be removed by the neoprobe. I painted the specimen. I then did a mammogram which confirmed removal of the clip in the seed. I then obtained hemostasis. I then closed with 2-0 Vicryl, 3-0 Vicryl, and 5-0 Monocryl. I infiltrated with plain quarter percent Marcaine. Then placed Dermabond and Steri-Strips overlying this. A breast binder was placed. She tolerated this well and was transferred to recovery  stable.

## 2014-06-17 NOTE — Transfer of Care (Signed)
Immediate Anesthesia Transfer of Care Note  Patient: Kelsey Greer  Procedure(s) Performed: Procedure(s): RADIOACTIVE SEED GUIDED EXCISIONAL BREAST BIOPSY (Left)  Patient Location: PACU  Anesthesia Type:General  Level of Consciousness: sedated  Airway & Oxygen Therapy: Patient Spontanous Breathing and Patient connected to face mask oxygen  Post-op Assessment: Report given to PACU RN and Post -op Vital signs reviewed and stable  Post vital signs: Reviewed and stable  Complications: No apparent anesthesia complications

## 2014-06-17 NOTE — Discharge Instructions (Signed)
[facility] °Office Phone Number [phone] ° °BREAST BIOPSY/ PARTIAL MASTECTOMY: POST OP INSTRUCTIONS ° °Always review your discharge instruction sheet given to you by the facility where your surgery was performed. ° °IF YOU HAVE DISABILITY OR FAMILY LEAVE FORMS, YOU MUST BRING THEM TO THE OFFICE FOR PROCESSING.  DO NOT GIVE THEM TO YOUR DOCTOR. ° °1. A prescription for pain medication may be given to you upon discharge.  Take your pain medication as prescribed, if needed.  If narcotic pain medicine is not needed, then you may take acetaminophen (Tylenol), naprosyn (Alleve) or ibuprofen (Advil) as needed. °2. Take your usually prescribed medications unless otherwise directed °3. If you need a refill on your pain medication, please contact your pharmacy.  They will contact our office to request authorization.  Prescriptions will not be filled after 5pm or on week-ends. °4. You should eat very light the first 24 hours after surgery, such as soup, crackers, pudding, etc.  Resume your normal diet the day after surgery. °5. Most patients will experience some swelling and bruising in the breast.  Ice packs and a good support bra will help.  Wear the breast binder provided or a sports bra for 72 hours day and night.  After that wear a sports bra during the day until you return to the office. Swelling and bruising can take several days to resolve.  °6. It is common to experience some constipation if taking pain medication after surgery.  Increasing fluid intake and taking a stool softener will usually help or prevent this problem from occurring.  A mild laxative (Milk of Magnesia or Miralax) should be taken according to package directions if there are no bowel movements after 48 hours. °7. Unless discharge instructions indicate otherwise, you may remove your bandages 48 hours after surgery and you may shower at that time.  You may have steri-strips (small skin tapes) in place directly over the incision.   These strips should be left on the skin for 7-10 days and will come off on their own.  If your surgeon used skin glue on the incision, you may shower in 24 hours.  The glue will flake off over the next 2-3 weeks.  Any sutures or staples will be removed at the office during your follow-up visit. °8. ACTIVITIES:  You may resume regular daily activities (gradually increasing) beginning the next day.  Wearing a good support bra or sports bra minimizes pain and swelling.  You may have sexual intercourse when it is comfortable. °a. You may drive when you no longer are taking prescription pain medication, you can comfortably wear a seatbelt, and you can safely maneuver your car and apply brakes. °b. RETURN TO WORK:  ______________________________________________________________________________________ °9. You should see your doctor in the office for a follow-up appointment approximately two weeks after your surgery.  Your doctor’s nurse will typically make your follow-up appointment when she calls you with your pathology report.  Expect your pathology report 3-4 business days after your surgery.  You may call to check if you do not hear from us after three days. °10. OTHER INSTRUCTIONS: _______________________________________________________________________________________________ _____________________________________________________________________________________________________________________________________ °_____________________________________________________________________________________________________________________________________ °_____________________________________________________________________________________________________________________________________ ° °WHEN TO CALL DR WAKEFIELD: °1. Fever over 101.0 °2. Nausea and/or vomiting. °3. Extreme swelling or bruising. °4. Continued bleeding from incision. °5. Increased pain, redness, or drainage from the incision. ° °The clinic staff is available to  answer your questions during regular business hours.  Please don’t hesitate to call and ask to speak to one of the nurses for   clinical concerns.  If you have a medical emergency, go to the nearest emergency room or call 911.  A surgeon from [facility] is always on call at the hospital. ° °For further questions, please visit [email or web address] ° ° ° °Post Anesthesia Home Care Instructions ° °Activity: °Get plenty of rest for the remainder of the day. A responsible adult should stay with you for 24 hours following the procedure.  °For the next 24 hours, DO NOT: °-Drive a car °-Operate machinery °-Drink alcoholic beverages °-Take any medication unless instructed by your physician °-Make any legal decisions or sign important papers. ° °Meals: °Start with liquid foods such as gelatin or soup. Progress to regular foods as tolerated. Avoid greasy, spicy, heavy foods. If nausea and/or vomiting occur, drink only clear liquids until the nausea and/or vomiting subsides. Call your physician if vomiting continues. ° °Special Instructions/Symptoms: °Your throat may feel dry or sore from the anesthesia or the breathing tube placed in your throat during surgery. If this causes discomfort, gargle with warm salt water. The discomfort should disappear within 24 hours. ° ° ° °Call your surgeon if you experience:  ° °1.  Fever over 101.0. °2.  Inability to urinate. °3.  Nausea and/or vomiting. °4.  Extreme swelling or bruising at the surgical site. °5.  Continued bleeding from the incision. °6.  Increased pain, redness or drainage from the incision. °7.  Problems related to your pain medication. °8. Any change in color, movement and/or sensation °9. Any problems and/or concerns °

## 2014-06-17 NOTE — Anesthesia Preprocedure Evaluation (Addendum)
Anesthesia Evaluation  Patient identified by MRN, date of birth, ID band Patient awake    Reviewed: Allergy & Precautions, H&P , NPO status , Patient's Chart, lab work & pertinent test results  Airway Mallampati: I  Neck ROM: Full    Dental  (+) Teeth Intact   Pulmonary neg pulmonary ROS,  breath sounds clear to auscultation        Cardiovascular negative cardio ROS  Rhythm:Regular Rate:Normal     Neuro/Psych negative neurological ROS     GI/Hepatic negative GI ROS, Neg liver ROS,   Endo/Other  negative endocrine ROS  Renal/GU negative Renal ROS     Musculoskeletal   Abdominal   Peds  Hematology negative hematology ROS (+)   Anesthesia Other Findings   Reproductive/Obstetrics                          Anesthesia Physical Anesthesia Plan  ASA: I  Anesthesia Plan: General   Post-op Pain Management:    Induction: Intravenous  Airway Management Planned: LMA  Additional Equipment:   Intra-op Plan:   Post-operative Plan: Extubation in OR  Informed Consent: I have reviewed the patients History and Physical, chart, labs and discussed the procedure including the risks, benefits and alternatives for the proposed anesthesia with the patient or authorized representative who has indicated his/her understanding and acceptance.   Dental advisory given  Plan Discussed with: CRNA and Surgeon  Anesthesia Plan Comments:         Anesthesia Quick Evaluation

## 2014-07-20 ENCOUNTER — Encounter (HOSPITAL_BASED_OUTPATIENT_CLINIC_OR_DEPARTMENT_OTHER): Payer: Self-pay | Admitting: *Deleted

## 2015-03-09 ENCOUNTER — Other Ambulatory Visit: Payer: Self-pay | Admitting: General Surgery

## 2015-03-09 ENCOUNTER — Ambulatory Visit
Admission: RE | Admit: 2015-03-09 | Discharge: 2015-03-09 | Disposition: A | Discharge status: Home / Self Care | Payer: BLUE CROSS/BLUE SHIELD | Source: Ambulatory Visit | Attending: General Surgery | Admitting: General Surgery

## 2015-03-09 DIAGNOSIS — N6320 Unspecified lump in the left breast, unspecified quadrant: Secondary | ICD-10-CM

## 2015-03-29 ENCOUNTER — Ambulatory Visit
Admission: RE | Admit: 2015-03-29 | Discharge: 2015-03-29 | Disposition: A | Discharge status: Home / Self Care | Payer: BLUE CROSS/BLUE SHIELD | Source: Ambulatory Visit | Attending: Internal Medicine | Admitting: Internal Medicine

## 2015-03-29 ENCOUNTER — Other Ambulatory Visit: Payer: Self-pay | Admitting: Internal Medicine

## 2015-03-29 DIAGNOSIS — J189 Pneumonia, unspecified organism: Secondary | ICD-10-CM

## 2016-02-04 ENCOUNTER — Other Ambulatory Visit: Payer: Self-pay

## 2016-02-04 DIAGNOSIS — Z1231 Encounter for screening mammogram for malignant neoplasm of breast: Secondary | ICD-10-CM

## 2016-03-17 ENCOUNTER — Ambulatory Visit
Admission: RE | Admit: 2016-03-17 | Discharge: 2016-03-17 | Disposition: A | Discharge status: Home / Self Care | Payer: BLUE CROSS/BLUE SHIELD | Source: Ambulatory Visit

## 2016-03-17 ENCOUNTER — Other Ambulatory Visit: Payer: Self-pay | Admitting: Obstetrics and Gynecology

## 2016-03-17 DIAGNOSIS — Z1231 Encounter for screening mammogram for malignant neoplasm of breast: Secondary | ICD-10-CM

## 2017-02-05 ENCOUNTER — Other Ambulatory Visit: Payer: Self-pay | Admitting: Obstetrics and Gynecology

## 2017-02-05 DIAGNOSIS — Z1231 Encounter for screening mammogram for malignant neoplasm of breast: Secondary | ICD-10-CM

## 2017-03-19 ENCOUNTER — Encounter (INDEPENDENT_AMBULATORY_CARE_PROVIDER_SITE_OTHER): Payer: Self-pay

## 2017-03-19 ENCOUNTER — Ambulatory Visit
Admission: RE | Admit: 2017-03-19 | Discharge: 2017-03-19 | Disposition: A | Discharge status: Home / Self Care | Payer: BLUE CROSS/BLUE SHIELD | Source: Ambulatory Visit | Attending: Obstetrics and Gynecology | Admitting: Obstetrics and Gynecology

## 2017-03-19 DIAGNOSIS — Z1231 Encounter for screening mammogram for malignant neoplasm of breast: Secondary | ICD-10-CM

## 2018-02-15 ENCOUNTER — Other Ambulatory Visit: Payer: Self-pay | Admitting: Obstetrics and Gynecology

## 2018-02-15 DIAGNOSIS — Z1231 Encounter for screening mammogram for malignant neoplasm of breast: Secondary | ICD-10-CM

## 2018-03-20 ENCOUNTER — Ambulatory Visit
Admission: RE | Admit: 2018-03-20 | Discharge: 2018-03-20 | Disposition: A | Discharge status: Home / Self Care | Payer: 59 | Source: Ambulatory Visit | Attending: Obstetrics and Gynecology | Admitting: Obstetrics and Gynecology

## 2018-03-20 DIAGNOSIS — Z1231 Encounter for screening mammogram for malignant neoplasm of breast: Secondary | ICD-10-CM | POA: Diagnosis not present

## 2018-04-16 DIAGNOSIS — N952 Postmenopausal atrophic vaginitis: Secondary | ICD-10-CM | POA: Diagnosis not present

## 2018-04-16 DIAGNOSIS — N951 Menopausal and female climacteric states: Secondary | ICD-10-CM | POA: Diagnosis not present

## 2018-04-16 DIAGNOSIS — R87610 Atypical squamous cells of undetermined significance on cytologic smear of cervix (ASC-US): Secondary | ICD-10-CM | POA: Diagnosis not present

## 2018-04-16 DIAGNOSIS — Z681 Body mass index (BMI) 19 or less, adult: Secondary | ICD-10-CM | POA: Diagnosis not present

## 2018-04-16 DIAGNOSIS — Z01419 Encounter for gynecological examination (general) (routine) without abnormal findings: Secondary | ICD-10-CM | POA: Diagnosis not present

## 2018-10-15 ENCOUNTER — Other Ambulatory Visit: Payer: Self-pay | Admitting: Internal Medicine

## 2018-10-15 DIAGNOSIS — Z789 Other specified health status: Secondary | ICD-10-CM | POA: Diagnosis not present

## 2018-10-15 DIAGNOSIS — R911 Solitary pulmonary nodule: Secondary | ICD-10-CM

## 2018-10-15 DIAGNOSIS — Z0000 Encounter for general adult medical examination without abnormal findings: Secondary | ICD-10-CM | POA: Diagnosis not present

## 2018-10-15 DIAGNOSIS — Z23 Encounter for immunization: Secondary | ICD-10-CM | POA: Diagnosis not present

## 2018-10-15 DIAGNOSIS — E538 Deficiency of other specified B group vitamins: Secondary | ICD-10-CM | POA: Diagnosis not present

## 2018-10-22 ENCOUNTER — Other Ambulatory Visit: Payer: 59

## 2018-10-25 ENCOUNTER — Ambulatory Visit (HOSPITAL_COMMUNITY): Payer: 59

## 2018-11-05 DIAGNOSIS — H5213 Myopia, bilateral: Secondary | ICD-10-CM | POA: Diagnosis not present

## 2018-11-05 DIAGNOSIS — H01002 Unspecified blepharitis right lower eyelid: Secondary | ICD-10-CM | POA: Diagnosis not present

## 2018-11-05 DIAGNOSIS — H01005 Unspecified blepharitis left lower eyelid: Secondary | ICD-10-CM | POA: Diagnosis not present

## 2018-11-27 ENCOUNTER — Ambulatory Visit (HOSPITAL_COMMUNITY)
Admission: RE | Admit: 2018-11-27 | Discharge: 2018-11-27 | Disposition: A | Discharge status: Home / Self Care | Payer: 59 | Source: Ambulatory Visit | Attending: Internal Medicine | Admitting: Internal Medicine

## 2018-11-27 ENCOUNTER — Other Ambulatory Visit (HOSPITAL_COMMUNITY): Payer: Self-pay | Admitting: Internal Medicine

## 2018-11-27 ENCOUNTER — Other Ambulatory Visit: Payer: Self-pay

## 2018-11-27 ENCOUNTER — Ambulatory Visit
Admission: RE | Admit: 2018-11-27 | Discharge: 2018-11-27 | Disposition: A | Discharge status: Home / Self Care | Payer: Self-pay | Source: Ambulatory Visit | Attending: Internal Medicine | Admitting: Internal Medicine

## 2018-11-27 DIAGNOSIS — R911 Solitary pulmonary nodule: Secondary | ICD-10-CM | POA: Insufficient documentation

## 2018-11-27 DIAGNOSIS — R52 Pain, unspecified: Secondary | ICD-10-CM

## 2018-11-27 DIAGNOSIS — E538 Deficiency of other specified B group vitamins: Secondary | ICD-10-CM | POA: Diagnosis not present

## 2018-11-29 ENCOUNTER — Telehealth: Payer: Self-pay | Admitting: Internal Medicine

## 2018-11-29 NOTE — Telephone Encounter (Signed)
Call from patient Kelsey Greer . She says PCP Kelsey Orn, MD -> wants her to see me for new CT findings of micronodule RUL. Visualized her CT - looks like MAI . She does have mold exposure at home. Mild stuffiness of nose due to allergy and related dyspnea.  But no covid symptoms  Plan - given covid outbreak maintan social distancing - do clinic visit in 4 months; give appt - she will be in touch with me if her stiuation changes    SIGNATURE    Dr. Brand Males, M.D., F.C.C.P,  Pulmonary and Critical Care Medicine Staff Physician, [place] Director - Interstitial Lung Disease  Program  Pulmonary [place] at [place], [place], [address]  Pager: [phone], If no answer or between  15:00h - 7:00h: call [phone] Telephone: [phone]  5:53 PM 11/29/2018

## 2018-12-02 NOTE — Telephone Encounter (Signed)
Attempted to call pt but unable to reach. Left message for pt to return call. 

## 2018-12-11 NOTE — Telephone Encounter (Signed)
Recall has been placed for pt to have consult with MR in July. Nothing further needed.

## 2019-02-04 ENCOUNTER — Other Ambulatory Visit: Payer: Self-pay | Admitting: Obstetrics and Gynecology

## 2019-02-04 DIAGNOSIS — Z1231 Encounter for screening mammogram for malignant neoplasm of breast: Secondary | ICD-10-CM

## 2019-04-09 ENCOUNTER — Other Ambulatory Visit: Payer: Self-pay

## 2019-04-09 ENCOUNTER — Ambulatory Visit
Admission: RE | Admit: 2019-04-09 | Discharge: 2019-04-09 | Disposition: A | Discharge status: Home / Self Care | Payer: 59 | Source: Ambulatory Visit | Attending: Obstetrics and Gynecology | Admitting: Obstetrics and Gynecology

## 2019-04-09 DIAGNOSIS — Z1231 Encounter for screening mammogram for malignant neoplasm of breast: Secondary | ICD-10-CM | POA: Diagnosis not present

## 2019-04-15 DIAGNOSIS — Z03818 Encounter for observation for suspected exposure to other biological agents ruled out: Secondary | ICD-10-CM | POA: Diagnosis not present

## 2019-04-30 DIAGNOSIS — Z3040 Encounter for surveillance of contraceptives, unspecified: Secondary | ICD-10-CM | POA: Diagnosis not present

## 2019-04-30 DIAGNOSIS — N952 Postmenopausal atrophic vaginitis: Secondary | ICD-10-CM | POA: Diagnosis not present

## 2019-04-30 DIAGNOSIS — N898 Other specified noninflammatory disorders of vagina: Secondary | ICD-10-CM | POA: Diagnosis not present

## 2019-04-30 DIAGNOSIS — Z01411 Encounter for gynecological examination (general) (routine) with abnormal findings: Secondary | ICD-10-CM | POA: Diagnosis not present

## 2019-04-30 DIAGNOSIS — Z1211 Encounter for screening for malignant neoplasm of colon: Secondary | ICD-10-CM | POA: Diagnosis not present

## 2019-04-30 DIAGNOSIS — Z1239 Encounter for other screening for malignant neoplasm of breast: Secondary | ICD-10-CM | POA: Diagnosis not present

## 2019-04-30 DIAGNOSIS — Z6820 Body mass index (BMI) 20.0-20.9, adult: Secondary | ICD-10-CM | POA: Diagnosis not present

## 2019-06-06 ENCOUNTER — Telehealth: Payer: Self-pay | Admitting: Internal Medicine

## 2019-06-06 NOTE — Telephone Encounter (Signed)
ATC patient unable to reach, left message to call back   

## 2019-06-06 NOTE — Telephone Encounter (Signed)
Kelsey Greer is known to me. At onset of pandemic she had CT wich showed some nodules. Radiologist advised followup CT 3-6 months. Let her knw to do one CT chest without contrast and come to office for visit - will be new consult  If she is worried about CT being too soon - ok to wait till Dec 2020 or she can call me     SIGNATURE    Dr. Brand Males, M.D., F.C.C.P,  Pulmonary and Critical Care Medicine Staff Physician, [place] Director - Interstitial Lung Disease  Program  Pulmonary [place] at [place], [place], [address]  Pager: [phone], If no answer or between  15:00h - 7:00h: call [phone] Telephone: [phone]  1:46 PM 06/06/2019

## 2019-06-16 ENCOUNTER — Institutional Professional Consult (permissible substitution): Payer: 59 | Admitting: Internal Medicine

## 2019-06-16 ENCOUNTER — Telehealth: Payer: Self-pay | Admitting: Internal Medicine

## 2019-06-16 ENCOUNTER — Other Ambulatory Visit: Payer: Self-pay

## 2019-06-16 DIAGNOSIS — Z20822 Contact with and (suspected) exposure to covid-19: Secondary | ICD-10-CM

## 2019-06-16 DIAGNOSIS — R918 Other nonspecific abnormal finding of lung field: Secondary | ICD-10-CM

## 2019-06-16 DIAGNOSIS — R6889 Other general symptoms and signs: Secondary | ICD-10-CM | POA: Diagnosis not present

## 2019-06-16 NOTE — Telephone Encounter (Signed)
Pt was scheduled for a consult today but was rescheduled d/t covid-like symptoms.  Per MR: pt needs covid testing, pt needs to have a CT chest in the next few days, and reschedule consult after this has been completed.    These have been ordered.  Will reschedule rov after CT is scheduled.  Nothing further needed at this time- will close encounter.

## 2019-06-16 NOTE — Telephone Encounter (Signed)
Patient had new appt visit for followup of lung nodules of a CT chest on 11/27/2018. She came at front door and we were told she had runny nose and sore throat. I called patient Kelsey Greer  And she told me she has been following low risk covid-19 activities only. She has recurrence of allergy that is normal this season with scratchy throat. She has come canker sore and ear ache. However, she is willing to get covid testing 06/16/2019\ to be on safe side   Plan  - do acute covid-19 testing vis drive through  - get CT chest without contrast -> for nodules: do in 2-3 weeks and then come for 30 min new visit

## 2019-06-16 NOTE — Telephone Encounter (Signed)
covid test has been ordered, and ct has been ordered.  Will hold message until ct has been scheduled so rov can be scheduled appropriately.

## 2019-06-17 LAB — NOVEL CORONAVIRUS, NAA: SARS-CoV-2, NAA: NOT DETECTED

## 2019-06-20 ENCOUNTER — Telehealth: Payer: Self-pay | Admitting: Internal Medicine

## 2019-06-20 NOTE — Telephone Encounter (Signed)
I have called the patient and made her aware that we have gotten the auth for her ct

## 2019-06-23 NOTE — Telephone Encounter (Signed)
CT and ROV have been scheduled.

## 2019-07-11 ENCOUNTER — Other Ambulatory Visit: Payer: Self-pay

## 2019-07-11 ENCOUNTER — Ambulatory Visit (INDEPENDENT_AMBULATORY_CARE_PROVIDER_SITE_OTHER)
Admission: RE | Admit: 2019-07-11 | Discharge: 2019-07-11 | Disposition: A | Discharge status: Home / Self Care | Payer: 59 | Source: Ambulatory Visit | Attending: Internal Medicine | Admitting: Internal Medicine

## 2019-07-11 DIAGNOSIS — R918 Other nonspecific abnormal finding of lung field: Secondary | ICD-10-CM

## 2019-07-14 ENCOUNTER — Other Ambulatory Visit: Payer: Self-pay

## 2019-07-14 ENCOUNTER — Ambulatory Visit: Payer: 59 | Admitting: Internal Medicine

## 2019-07-14 ENCOUNTER — Encounter: Payer: Self-pay | Admitting: Internal Medicine

## 2019-07-14 VITALS — BP 112/62 | HR 70 | Ht 63.5 in | Wt 126.6 lb

## 2019-07-14 DIAGNOSIS — Z23 Encounter for immunization: Secondary | ICD-10-CM | POA: Diagnosis not present

## 2019-07-14 DIAGNOSIS — R0689 Other abnormalities of breathing: Secondary | ICD-10-CM | POA: Diagnosis not present

## 2019-07-14 DIAGNOSIS — R0982 Postnasal drip: Secondary | ICD-10-CM

## 2019-07-14 DIAGNOSIS — R918 Other nonspecific abnormal finding of lung field: Secondary | ICD-10-CM | POA: Diagnosis not present

## 2019-07-14 DIAGNOSIS — Z77120 Contact with and (suspected) exposure to mold (toxic): Secondary | ICD-10-CM

## 2019-07-14 NOTE — Patient Instructions (Addendum)
Abnormal findings on diagnostic imaging of lung  - will email Dr Rosario Jacks and get input on scans 2016 -> March 2020 -> current CT - serial monitoring best approach for now   Post-nasal drip   -  take generic fluticasone inhaler 2 squirts each nostril daily - if persists, allergy eval can be a consideration   Breath sounds, abnormal  - get full PFT with BD response at your convenience  - hospital or office  Mold exposure  - you have done the right things for the house - will get you information on the someone who can inspect the house  Flu Vaccine  - flu shot 07/14/2019   Followup  - based on radiologist input and PFT result

## 2019-07-14 NOTE — Progress Notes (Signed)
OV 07/14/2019  Subjective:  Patient ID: Kelsey Greer, female , DOB: Sep 29, 1965 , age 53 y.o. , MRN: [id number] , ADDRESS: [place] [id number]   07/14/2019 -   Chief Complaint  Patient presents with  . Pulmonary Consult    Referred for Abnormal CT scan.      HPI Kelsey Greer 53 y.o. -female   She presents for evaluation of pulmonary nodules.  This is the first visit.  The initial first visit was supposed to be in March 2020 following her CT scan but with the onset of the pandemic this was shelved.  She tells me that for many years she is to have recurrent winter bronchitis/respiratory infection and this would happen on an annual basis.  In 2010 it was significant enough that she coughed for several weeks/few months.  She almost canceled her trip to [place] where she went hiking.  She was then treated with antibiotics plus minus steroids and the cough resolved and this allowed her to go to [place].  She was able to hike well at that time.  Then in 2016 she was visiting [place].  Her daughter was in college at that time.  She found mold in the living space and spent some time cleaning it.  After that she got acutely short of breath in the night associated with some chest heaviness particularly on the right side.  She checked herself into the ER and was apparently told she might have "fungal pneumonia".  She recollects having had a CT scan at that time.  Symptoms subsequently resolved.  She did have a follow-up CT scan of the chest in 2016 in [place] and this showed a possible left lower lobe groundglass opacity.  She was instructed to follow-up on this because of concern of potential malignancy.  However she did not follow-up on this till her March 2020 scan which showed different findings of micro nodularity [multiple].  She tells me that after her 2016 episode she has been quite well.  She exercises regularly.  She lives physically fit and  healthy.  She has not had any symptoms of the respiratory tract including cough wheezing chest tightness and shortness of breath or hemoptysis.  Except prior to her CT scan in March 2020 in the days leading up to it while walking she did have one episode of shortness of breath but since then she is not had any problems.  She lives in [place] in an older home.  She moved into this home 2 years ago.  After moving and she discovered mold in the basement where her laundry machines are.  The summer 2020 she spent time cleaning it but for the last 2 years she periodically enters the space to do her laundry.  Other than this there is no known mold exposure.  She is worried she might have mold related pulmonary infection.  She does have some night sweats but she thinks this is menopause related but otherwise no other symptoms.  Other than that she endorses a chronic postnasal drip which she says many of her friends suffer from.  She is recently been asked to take Claritin which helps partially but she does not take it all the time.  Early in the morning she has to cough and clear her throat.  She is wondering if the postnasal drip is somewhat contributory to her pulmonary findings.   She is interested in knowing the agencies that can come and inspect  the mold level in a house and give her some cleaning advised.   CLINICAL DATA:  Follow-up pulmonary nodules  EXAM: CT CHEST WITHOUT CONTRAST from October r 2020 that I personally visualized  TECHNIQUE: Multidetector CT imaging of the chest was performed following the standard protocol without IV contrast.  COMPARISON:  CT chest dated 11/28/2018  FINDINGS: Cardiovascular: The heart is normal in size. No pericardial effusion.  No evidence of thoracic aortic aneurysm.  Mediastinum/Nodes: No suspicious mediastinal lymphadenopathy.  Visualized thyroid is unremarkable.  Lungs/Pleura: Biapical pleural-parenchymal scarring.  Mild clustered  peribronchovascular nodularity in the posterior right upper lobe (series 3/image 55), anterior/inferior right upper lobe (series 3/image 83), lateral right middle lobe (series 3/image 105), lingula (series 3/image 107), and lateral right lower lobe (series 3/image 118). This appearance is similar to the prior, favoring atypical mycobacterial infection.  No focal consolidation.  No pleural effusion or pneumothorax.  Upper Abdomen: Visualized upper abdomen is notable for small hepatic cysts.  Musculoskeletal: Visualized osseous structures are within normal limits.  IMPRESSION: Mild clustered peribronchovascular nodularity scattered in the lungs bilaterally, unchanged from the prior, favoring chronic atypical mycobacterial infection.  No suspicious pulmonary nodules.   Electronically Signed   By: Julian Hy M.D.   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   reports that she has never smoked. She has never used smokeless tobacco.  Past Surgical History:  Procedure Laterality Date  . BREAST EXCISIONAL BIOPSY Left 2015  . COMBINED HYSTEROSCOPY DIAGNOSTIC / D&C  06/29/2005   with removal of endometrial polyps  . ENDOMETRIAL ABLATION W/ NOVASURE    . GYNECOLOGIC CRYOSURGERY  1989    Allergies  Allergen Reactions  . Etodolac Rash  . Penicillins Rash    Immunization History  Administered Date(s) Administered  . Influenza,inj,Quad PF,6+ Mos 10/11/2015  . Influenza,inj,quad, With Preservative 09/19/2015    Family History  Problem Relation Age of Onset  . Cancer Maternal Aunt   . Breast cancer Maternal Aunt   . Cancer Maternal Grandmother   . Cancer Maternal Grandfather   . Cancer Paternal Grandmother   . Breast cancer Paternal Aunt      Current Outpatient Medications:  Marland Kitchen  Multiple Vitamins-Minerals (MULTIVITAMIN ADULT PO), Take 1  tablet by mouth daily. , Disp: , Rfl:       Objective:   Vitals:   07/14/19 1043  BP: 112/62  Pulse: 70  SpO2: 97%  Weight: 57.4 kg  Height: 5' 3.5" (1.613 m)    Estimated body mass index is 22.07 kg/m as calculated from the following:   Height as of this encounter: 5' 3.5" (1.613 m).   Weight as of this encounter: 57.4 kg.  _0 @  Filed Weights   07/14/19 1043  Weight: 57.4 kg     Physical Exam  General Appearance:    Alert, cooperative, no distress, appears stated age - yes , Deconditioned looking - no , OBESE  - no, Sitting on Wheelchair -  no  Head:    Normocephalic, without obvious abnormality, atraumatic  Eyes:    PERRL, conjunctiva/corneas clear,  Ears:    Normal TM's and external ear canals, both ears  Nose:   Nares normal, septum midline, mucosa normal, no drainage    or sinus tenderness. OXYGEN ON  - no . Patient is @ ra   Throat:   Lips, mucosa, and tongue  normal; teeth and gums normal. Cyanosis on lips - no  Neck:   Supple, symmetrical, trachea midline, no adenopathy;    thyroid:  no enlargement/tenderness/nodules; no carotid   bruit or JVD  Back:     Symmetric, no curvature, ROM normal, no CVA tenderness  Lungs:     Distress - no , Wheeze SQUEAKS lung base - end inspiration, Barrell Chest - no, Purse lip breathing - no, Crackles - no   Chest Wall:    No tenderness or deformity.    Heart:    Regular rate and rhythm, S1 and S2 normal, no rub   or gallop, Murmur - no  Breast Exam:    NOT DONE  Abdomen:     Soft, non-tender, bowel sounds active all four quadrants,    no masses, no organomegaly. Visceral obesity - no  Genitalia:   NOT DONE  Rectal:   NOT DONE  Extremities:   Extremities - normal, Has Cane - no, Clubbing - no, Edema - no  Pulses:   2+ and symmetric all extremities  Skin:   Stigmata of Connective Tissue Disease - no  Lymph nodes:   Cervical, supraclavicular, and axillary nodes normal  Psychiatric:  Neurologic:   Pleasant - yes,  Anxious - no, Flat affect - no  CAm-ICU - neg, Alert and Oriented x 3 - yes, Moves all 4s - yes, Speech - normal, Cognition - intact           Assessment:       ICD-10-CM   1. Abnormal findings on diagnostic imaging of lung  R91.8   2. Post-nasal drip  R09.82   3. Breath sounds, abnormal  R06.89   4. Mold exposure  Z77.120   5. Flu vaccine need  Z23        Plan:     Patient Instructions  Abnormal findings on diagnostic imaging of lung   -These findings suggestive of Mycobacterium avium complex as described by the radiologist.  I suspect that with previous infectious episode she might of had mild abnormalities of her respiratory tract resulting in MAI colonization.  She is asymptomatic other than the scattered squeak in the lung that I heard in the lung base.  Therefore the best approach for this is very conservative with serial monitoring unless pulmonary function test is grossly abnormal  Plan - will email Dr Rosario Jacks and get input on scans 2016 -> March 2020 -> current CT - serial monitoring best approach for now   Post-nasal drip   -  take generic fluticasone inhaler 2 squirts each nostril daily - if persists, allergy eval can be a consideration   Breath sounds, abnormal  - get full PFT with BD response at your convenience  - hospital or office  Mold exposure  - you have done the right things for the house - will get you information on the someone who can inspect the house  Flu Vaccine  - flu shot 07/14/2019   Followup  - based on radiologist input and PFT result   Overall she is agreeable with the above plan.  SIGNATURE    Dr. Brand Males, M.D., F.C.C.P,  Pulmonary and Critical Care Medicine Staff Physician, [place] Director - Interstitial Lung Disease  Program  Pulmonary [place] at [place], [place], [address]  Pager: [phone], If no answer or between  15:00h - 7:00h: call [phone] Telephone: [phone]  11:24 AM 07/14/2019

## 2019-07-18 ENCOUNTER — Telehealth: Payer: Self-pay | Admitting: Internal Medicine

## 2019-07-18 NOTE — Telephone Encounter (Signed)
Will route to [facility] pool to see if can help with getting the PFT scheduled sooner.

## 2019-07-18 NOTE — Telephone Encounter (Signed)
Patient called me to state that PFT scheduled for sometime late in December. Can we get this done sooner at [place], or [place] or [facility] or our opffice please. Ideally like next few to several weeks. She was wheezy on exam  Thanks  MR

## 2019-07-18 NOTE — Telephone Encounter (Signed)
Right now there is an opening in our office next Friday at 4:00 (11/6) or we can get her scheduled at [place] pt & left her a vm to call me back.

## 2019-07-18 NOTE — Telephone Encounter (Signed)
Spoke to pt & was able to get her scheduled in our office next Friday & scheduled for covid test on Tuesday.  Nothing further needed.

## 2019-07-22 ENCOUNTER — Other Ambulatory Visit (HOSPITAL_COMMUNITY)
Admission: RE | Admit: 2019-07-22 | Discharge: 2019-07-22 | Disposition: A | Discharge status: Home / Self Care | Payer: 59 | Source: Ambulatory Visit | Attending: Internal Medicine | Admitting: Internal Medicine

## 2019-07-22 DIAGNOSIS — Z01812 Encounter for preprocedural laboratory examination: Secondary | ICD-10-CM | POA: Insufficient documentation

## 2019-07-22 DIAGNOSIS — Z20828 Contact with and (suspected) exposure to other viral communicable diseases: Secondary | ICD-10-CM | POA: Insufficient documentation

## 2019-07-23 LAB — NOVEL CORONAVIRUS, NAA (HOSP ORDER, SEND-OUT TO REF LAB; TAT 18-24 HRS): SARS-CoV-2, NAA: NOT DETECTED

## 2019-07-25 ENCOUNTER — Ambulatory Visit (INDEPENDENT_AMBULATORY_CARE_PROVIDER_SITE_OTHER): Payer: 59 | Admitting: Internal Medicine

## 2019-07-25 ENCOUNTER — Other Ambulatory Visit: Payer: Self-pay

## 2019-07-25 DIAGNOSIS — R918 Other nonspecific abnormal finding of lung field: Secondary | ICD-10-CM | POA: Diagnosis not present

## 2019-07-25 DIAGNOSIS — R0689 Other abnormalities of breathing: Secondary | ICD-10-CM

## 2019-07-25 LAB — PULMONARY FUNCTION TEST
DL/VA % pred: 124 %
DL/VA: 5.3 ml/min/mmHg/L
DLCO unc % pred: 125 %
DLCO unc: 25.72 ml/min/mmHg
FEF 25-75 Post: 2.91 L/sec
FEF 25-75 Pre: 2.3 L/sec
FEF2575-%Change-Post: 26 %
FEF2575-%Pred-Post: 114 %
FEF2575-%Pred-Pre: 90 %
FEV1-%Change-Post: 5 %
FEV1-%Pred-Post: 97 %
FEV1-%Pred-Pre: 92 %
FEV1-Post: 2.6 L
FEV1-Pre: 2.47 L
FEV1FVC-%Change-Post: 2 %
FEV1FVC-%Pred-Pre: 101 %
FEV6-%Change-Post: 2 %
FEV6-%Pred-Post: 95 %
FEV6-%Pred-Pre: 93 %
FEV6-Post: 3.16 L
FEV6-Pre: 3.07 L
FEV6FVC-%Change-Post: 0 %
FEV6FVC-%Pred-Post: 103 %
FEV6FVC-%Pred-Pre: 103 %
FVC-%Change-Post: 2 %
FVC-%Pred-Post: 93 %
FVC-%Pred-Pre: 90 %
FVC-Post: 3.16 L
FVC-Pre: 3.07 L
Post FEV1/FVC ratio: 82 %
Post FEV6/FVC ratio: 100 %
Pre FEV1/FVC ratio: 80 %
Pre FEV6/FVC Ratio: 100 %
RV % pred: 141 %
RV: 2.64 L
TLC % pred: 114 %
TLC: 5.74 L

## 2019-07-25 NOTE — Progress Notes (Signed)
Full PFT performed today. °

## 2019-07-31 ENCOUNTER — Telehealth: Payer: Self-pay | Admitting: Internal Medicine

## 2019-07-31 DIAGNOSIS — J387 Other diseases of larynx: Secondary | ICD-10-CM

## 2019-07-31 NOTE — Telephone Encounter (Signed)
I ran into Kelsey Greer and her husband yesterday evening in a social situation. Gave result of normal PFT.  During this time she revealed the fact that she has constant postnasal drip which she reported last visit.  However it appears she is also constantly clearing the throat.  She reports hoarseness of voice associated with this.  These are chronic longstanding symptoms.  Talking makes it worse.  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  Voice rest especially sleeping at night makes it better.  She does feel there is sometimes sounds inside the chest.  However there is no exertional dyspnea or chest tightness.  I expressed my assessment of the possibility that this is cough neuropathy or irritable larynx or LPR related symptoms.  Explained the mechanism .  Explained that for step in evaluation would involve ENT and then depending on the results especially if no specific pathology found then it would be a combination of voice rehabilitation with gabapentin and voice rest  Plan -She will try voice rest at home -We discussed ENT referral and she is agreeable -please refer to Dr. Ernestine Conrad at [facility]  Thank you     SIGNATURE    Dr. Brand Males, M.D., F.C.C.P,  Pulmonary and Critical Care Medicine Staff Physician, [place] Director - Interstitial Lung Disease  Program  Pulmonary [place] at [place], [place], [address]  Pager: [phone], If no answer or between  15:00h - 7:00h: call [phone] Telephone: [phone]  4:25 PM 07/31/2019

## 2019-08-01 NOTE — Telephone Encounter (Signed)
Order placed for ENT referral. Nothing further needed.

## 2019-08-25 ENCOUNTER — Encounter: Payer: Self-pay | Admitting: Internal Medicine

## 2019-08-25 DIAGNOSIS — Z8709 Personal history of other diseases of the respiratory system: Secondary | ICD-10-CM | POA: Diagnosis not present

## 2019-08-25 DIAGNOSIS — J384 Edema of larynx: Secondary | ICD-10-CM | POA: Diagnosis not present

## 2019-08-25 DIAGNOSIS — R490 Dysphonia: Secondary | ICD-10-CM | POA: Diagnosis not present

## 2019-08-25 DIAGNOSIS — Z7289 Other problems related to lifestyle: Secondary | ICD-10-CM | POA: Diagnosis not present

## 2019-09-09 ENCOUNTER — Other Ambulatory Visit: Payer: 59

## 2019-09-10 DIAGNOSIS — Z03818 Encounter for observation for suspected exposure to other biological agents ruled out: Secondary | ICD-10-CM | POA: Diagnosis not present

## 2019-09-10 DIAGNOSIS — Z20828 Contact with and (suspected) exposure to other viral communicable diseases: Secondary | ICD-10-CM | POA: Diagnosis not present

## 2019-10-15 DIAGNOSIS — Z03818 Encounter for observation for suspected exposure to other biological agents ruled out: Secondary | ICD-10-CM | POA: Diagnosis not present

## 2019-10-15 DIAGNOSIS — Z20828 Contact with and (suspected) exposure to other viral communicable diseases: Secondary | ICD-10-CM | POA: Diagnosis not present

## 2019-11-07 ENCOUNTER — Telehealth: Payer: Self-pay | Admitting: Internal Medicine

## 2019-11-07 DIAGNOSIS — R0989 Other specified symptoms and signs involving the circulatory and respiratory systems: Secondary | ICD-10-CM

## 2019-11-07 DIAGNOSIS — R6889 Other general symptoms and signs: Secondary | ICD-10-CM

## 2019-11-07 DIAGNOSIS — R918 Other nonspecific abnormal finding of lung field: Secondary | ICD-10-CM

## 2019-11-07 NOTE — Telephone Encounter (Signed)
Kelsey Greer  Please schedule a modified barium swallow [MBS] for patient Kelsey Greer .Marland Kitchen  She saw Dr. Ernestine Conrad of ENT at [facility] on August 25, 2019.  She is out of network for them and therefore prefers to have the test done here.  According to her information Dr. Joya Gaskins prefer the study be done there .  I emailed Dr. Joya Gaskins to see if there is any specific details that was required but I never heard back from them.  Is been a few weeks.  Patient is okay having the test done within our health system because of insurance issues  Therefore ordered modified barium swallow and this is for indication of chronic throat clearing and pulmonary nodules  Thanks    SIGNATURE    Dr. Brand Males, M.D., F.C.C.P,  Pulmonary and Critical Care Medicine Staff Physician, [place] Director - Interstitial Lung Disease  Program  Pulmonary [place] at [place], [place], [address]  Pager: [phone], If no answer or between  15:00h - 7:00h: call [phone] Telephone: [phone]  1:30 PM 11/07/2019

## 2019-11-11 NOTE — Telephone Encounter (Signed)
Order placed for the barium swallow. Nothing further needed.

## 2019-11-12 DIAGNOSIS — Z03818 Encounter for observation for suspected exposure to other biological agents ruled out: Secondary | ICD-10-CM | POA: Diagnosis not present

## 2019-11-12 DIAGNOSIS — Z20828 Contact with and (suspected) exposure to other viral communicable diseases: Secondary | ICD-10-CM | POA: Diagnosis not present

## 2019-11-13 ENCOUNTER — Other Ambulatory Visit (HOSPITAL_COMMUNITY): Payer: Self-pay

## 2019-11-13 DIAGNOSIS — R1310 Dysphagia, unspecified: Secondary | ICD-10-CM

## 2019-11-24 DIAGNOSIS — H5213 Myopia, bilateral: Secondary | ICD-10-CM | POA: Diagnosis not present

## 2019-11-24 DIAGNOSIS — H33303 Unspecified retinal break, bilateral: Secondary | ICD-10-CM | POA: Diagnosis not present

## 2019-11-26 ENCOUNTER — Ambulatory Visit (HOSPITAL_COMMUNITY): Payer: 59

## 2019-11-26 ENCOUNTER — Encounter (HOSPITAL_COMMUNITY): Payer: 59

## 2019-11-27 ENCOUNTER — Other Ambulatory Visit: Payer: Self-pay

## 2019-11-27 ENCOUNTER — Ambulatory Visit (HOSPITAL_COMMUNITY)
Admission: RE | Admit: 2019-11-27 | Discharge: 2019-11-27 | Disposition: A | Discharge status: Home / Self Care | Payer: 59 | Source: Ambulatory Visit | Attending: Internal Medicine | Admitting: Internal Medicine

## 2019-11-27 DIAGNOSIS — R918 Other nonspecific abnormal finding of lung field: Secondary | ICD-10-CM

## 2019-11-27 DIAGNOSIS — R0989 Other specified symptoms and signs involving the circulatory and respiratory systems: Secondary | ICD-10-CM

## 2019-11-27 DIAGNOSIS — R6889 Other general symptoms and signs: Secondary | ICD-10-CM

## 2019-11-27 DIAGNOSIS — R1310 Dysphagia, unspecified: Secondary | ICD-10-CM | POA: Insufficient documentation

## 2019-11-27 NOTE — Progress Notes (Signed)
Modified Barium Swallow Progress Note  Patient Details  Name: Kelsey Greer MRN: [id number] Date of Birth: 12-25-1965  Today's Date: 11/27/2019  Modified Barium Swallow completed.  Full report located under Chart Review in the Imaging Section.  Brief recommendations include the following:  Clinical Impression  Pt's oropharyngeal swallow is WFL. While there is no clear oropharyngeal cause for her throat clearing, barium did appear to move slowly through her esophagus. This happened particularly with the graham cracker and the barium tablet, which required several sips of thin liquid to clear (MD not present to confirm). SLP provided handout with esophageal precautions and provided education on a few strategies to reduce throat clearing, such as taking sips of water or sucking on a (non-menthol) lozenge. Would defer any additional w/u to MD, but would also recommend OP SLP evaluation for chronic throat clearing.   Swallow Evaluation Recommendations   Recommended Consults: Consider GI evaluation   SLP Diet Recommendations: Regular solids;Thin liquid   Liquid Administration via: Cup;Straw   Medication Administration: Whole meds with liquid   Supervision: Patient able to self feed   Compensations: Slow rate;Small sips/bites;Follow solids with liquid   Postural Changes: Remain semi-upright after after feeds/meals (Comment);Seated upright at 90 degrees   Oral Care Recommendations: Oral care BID         Osie Bond., M.A. [place] Pager [phone] Office [phone]  11/27/2019,12:05 PM

## 2019-12-15 ENCOUNTER — Ambulatory Visit: Payer: 59 | Attending: Internal Medicine

## 2019-12-15 DIAGNOSIS — Z23 Encounter for immunization: Secondary | ICD-10-CM

## 2019-12-15 NOTE — Progress Notes (Signed)
   Covid-19 Vaccination Clinic  Name:  Kelsey Greer    MRN: [id number] DOB: 21-Apr-1966  12/15/2019  [name] was observed post Covid-19 immunization for 15 minutes without incident. She was provided with Vaccine Information Sheet and instruction to access the V-Safe system.   [name] was instructed to call 911 with any severe reactions post vaccine: Marland Kitchen Difficulty breathing  . Swelling of face and throat  . A fast heartbeat  . A bad rash all over body  . Dizziness and weakness   Immunizations Administered    Name Date Dose VIS Date Route   Pfizer COVID-19 Vaccine 12/15/2019  9:04 AM 0.3 mL 08/29/2019 Intramuscular   Manufacturer: [place]   Lot: [id number]   [place]: [id number]

## 2020-02-24 ENCOUNTER — Other Ambulatory Visit: Payer: Self-pay | Admitting: Obstetrics and Gynecology

## 2020-02-24 DIAGNOSIS — Z1231 Encounter for screening mammogram for malignant neoplasm of breast: Secondary | ICD-10-CM

## 2020-04-09 ENCOUNTER — Ambulatory Visit: Payer: 59

## 2020-04-14 ENCOUNTER — Ambulatory Visit
Admission: RE | Admit: 2020-04-14 | Discharge: 2020-04-14 | Disposition: A | Discharge status: Home / Self Care | Payer: 59 | Source: Ambulatory Visit | Attending: Obstetrics and Gynecology | Admitting: Obstetrics and Gynecology

## 2020-04-14 ENCOUNTER — Other Ambulatory Visit: Payer: Self-pay

## 2020-04-14 DIAGNOSIS — Z1231 Encounter for screening mammogram for malignant neoplasm of breast: Secondary | ICD-10-CM

## 2020-05-20 DIAGNOSIS — N952 Postmenopausal atrophic vaginitis: Secondary | ICD-10-CM | POA: Diagnosis not present

## 2020-05-20 DIAGNOSIS — Z1211 Encounter for screening for malignant neoplasm of colon: Secondary | ICD-10-CM | POA: Diagnosis not present

## 2020-05-20 DIAGNOSIS — Z01411 Encounter for gynecological examination (general) (routine) with abnormal findings: Secondary | ICD-10-CM | POA: Diagnosis not present

## 2020-05-20 DIAGNOSIS — Z3040 Encounter for surveillance of contraceptives, unspecified: Secondary | ICD-10-CM | POA: Diagnosis not present

## 2020-05-20 DIAGNOSIS — Z681 Body mass index (BMI) 19 or less, adult: Secondary | ICD-10-CM | POA: Diagnosis not present

## 2020-05-20 DIAGNOSIS — Z1239 Encounter for other screening for malignant neoplasm of breast: Secondary | ICD-10-CM | POA: Diagnosis not present

## 2020-05-31 DIAGNOSIS — Z03818 Encounter for observation for suspected exposure to other biological agents ruled out: Secondary | ICD-10-CM | POA: Diagnosis not present

## 2020-05-31 DIAGNOSIS — Z20822 Contact with and (suspected) exposure to covid-19: Secondary | ICD-10-CM | POA: Diagnosis not present

## 2020-06-29 DIAGNOSIS — N952 Postmenopausal atrophic vaginitis: Secondary | ICD-10-CM | POA: Diagnosis not present

## 2020-08-04 DIAGNOSIS — H6122 Impacted cerumen, left ear: Secondary | ICD-10-CM | POA: Diagnosis not present

## 2020-08-04 DIAGNOSIS — Z23 Encounter for immunization: Secondary | ICD-10-CM | POA: Diagnosis not present

## 2020-10-07 DIAGNOSIS — D225 Melanocytic nevi of trunk: Secondary | ICD-10-CM | POA: Diagnosis not present

## 2020-10-07 DIAGNOSIS — L821 Other seborrheic keratosis: Secondary | ICD-10-CM | POA: Diagnosis not present

## 2020-10-07 DIAGNOSIS — L700 Acne vulgaris: Secondary | ICD-10-CM | POA: Diagnosis not present

## 2020-10-07 DIAGNOSIS — Z411 Encounter for cosmetic surgery: Secondary | ICD-10-CM | POA: Diagnosis not present

## 2020-10-07 DIAGNOSIS — Z86018 Personal history of other benign neoplasm: Secondary | ICD-10-CM | POA: Diagnosis not present

## 2020-10-07 DIAGNOSIS — L658 Other specified nonscarring hair loss: Secondary | ICD-10-CM | POA: Diagnosis not present

## 2020-10-07 DIAGNOSIS — L570 Actinic keratosis: Secondary | ICD-10-CM | POA: Diagnosis not present

## 2020-10-07 DIAGNOSIS — L578 Other skin changes due to chronic exposure to nonionizing radiation: Secondary | ICD-10-CM | POA: Diagnosis not present

## 2020-10-07 DIAGNOSIS — D2262 Melanocytic nevi of left upper limb, including shoulder: Secondary | ICD-10-CM | POA: Diagnosis not present

## 2020-10-18 DIAGNOSIS — E538 Deficiency of other specified B group vitamins: Secondary | ICD-10-CM | POA: Diagnosis not present

## 2020-10-18 DIAGNOSIS — Z0000 Encounter for general adult medical examination without abnormal findings: Secondary | ICD-10-CM | POA: Diagnosis not present

## 2020-10-18 DIAGNOSIS — Z23 Encounter for immunization: Secondary | ICD-10-CM | POA: Diagnosis not present

## 2020-11-19 DIAGNOSIS — L570 Actinic keratosis: Secondary | ICD-10-CM | POA: Diagnosis not present

## 2020-12-11 DIAGNOSIS — S61215A Laceration without foreign body of left ring finger without damage to nail, initial encounter: Secondary | ICD-10-CM | POA: Diagnosis not present

## 2021-01-19 DIAGNOSIS — L570 Actinic keratosis: Secondary | ICD-10-CM | POA: Diagnosis not present

## 2021-01-21 DIAGNOSIS — R509 Fever, unspecified: Secondary | ICD-10-CM | POA: Diagnosis not present

## 2021-01-21 DIAGNOSIS — R059 Cough, unspecified: Secondary | ICD-10-CM | POA: Diagnosis not present

## 2021-01-21 DIAGNOSIS — R52 Pain, unspecified: Secondary | ICD-10-CM | POA: Diagnosis not present

## 2021-01-21 DIAGNOSIS — J029 Acute pharyngitis, unspecified: Secondary | ICD-10-CM | POA: Diagnosis not present

## 2021-01-21 DIAGNOSIS — Z03818 Encounter for observation for suspected exposure to other biological agents ruled out: Secondary | ICD-10-CM | POA: Diagnosis not present

## 2021-01-26 DIAGNOSIS — H43812 Vitreous degeneration, left eye: Secondary | ICD-10-CM | POA: Diagnosis not present

## 2021-02-01 DIAGNOSIS — R21 Rash and other nonspecific skin eruption: Secondary | ICD-10-CM | POA: Diagnosis not present

## 2021-02-01 DIAGNOSIS — J069 Acute upper respiratory infection, unspecified: Secondary | ICD-10-CM | POA: Diagnosis not present

## 2021-02-01 DIAGNOSIS — R0789 Other chest pain: Secondary | ICD-10-CM | POA: Diagnosis not present

## 2021-03-17 ENCOUNTER — Ambulatory Visit
Admission: RE | Admit: 2021-03-17 | Discharge: 2021-03-17 | Disposition: A | Discharge status: Home / Self Care | Payer: 59 | Source: Ambulatory Visit | Attending: Internal Medicine | Admitting: Internal Medicine

## 2021-03-17 ENCOUNTER — Other Ambulatory Visit: Payer: Self-pay | Admitting: Internal Medicine

## 2021-03-17 DIAGNOSIS — R079 Chest pain, unspecified: Secondary | ICD-10-CM | POA: Diagnosis not present

## 2021-03-17 DIAGNOSIS — R053 Chronic cough: Secondary | ICD-10-CM

## 2021-03-17 DIAGNOSIS — R059 Cough, unspecified: Secondary | ICD-10-CM | POA: Diagnosis not present

## 2021-03-26 ENCOUNTER — Telehealth: Payer: Self-pay | Admitting: Internal Medicine

## 2021-03-26 DIAGNOSIS — J449 Chronic obstructive pulmonary disease, unspecified: Secondary | ICD-10-CM

## 2021-03-26 NOTE — Telephone Encounter (Signed)
  Kelsey Greer had CT chest Oct 2020 and then in June 2022 had abnormal cxr  Plan  - do CT chest without contrast around mid-end sept 2022 -> see me in clinic  Thanks  MR xxxxx   IMPRESSION:cxr 03/17/21 Airspace opacity most likely representing pneumonia in the lingula anteriorly. Apical scarring bilaterally. Lungs otherwise clear. Heart size normal.   These results will be called to the ordering clinician or representative by the Radiologist Assistant, and communication documented in the PACS or [employer].     Electronically Signed   By: Lowella Grip III M.D

## 2021-03-28 ENCOUNTER — Other Ambulatory Visit: Payer: Self-pay | Admitting: Internal Medicine

## 2021-03-28 DIAGNOSIS — Z1231 Encounter for screening mammogram for malignant neoplasm of breast: Secondary | ICD-10-CM

## 2021-03-28 NOTE — Telephone Encounter (Signed)
Attempted to call pt but unable to reach. Left message for her to return call.  Once pt returns call, we can then place order for CT and also get pt scheduled for a f/u with MR.

## 2021-03-29 NOTE — Telephone Encounter (Signed)
Call returned to patient, confirmed DOB. Patient had already spoken to MR. Aware order has been placed. Patient requested her OV be at end of august. And requesting her CT be done prior to her appt. CT order placed. OV appt made. Aware someone from our office will contact her to schedule her CT. Voiced understanding.   Nothing further needed at this time.

## 2021-05-13 DIAGNOSIS — L570 Actinic keratosis: Secondary | ICD-10-CM | POA: Diagnosis not present

## 2021-05-20 ENCOUNTER — Ambulatory Visit: Payer: Self-pay | Admitting: Internal Medicine

## 2021-05-24 DIAGNOSIS — U071 COVID-19: Secondary | ICD-10-CM | POA: Diagnosis not present

## 2021-05-30 ENCOUNTER — Ambulatory Visit
Admission: RE | Admit: 2021-05-30 | Discharge: 2021-05-30 | Disposition: A | Discharge status: Home / Self Care | Payer: 59 | Source: Ambulatory Visit | Attending: Internal Medicine | Admitting: Internal Medicine

## 2021-05-30 ENCOUNTER — Other Ambulatory Visit: Payer: Self-pay

## 2021-05-30 DIAGNOSIS — Z1231 Encounter for screening mammogram for malignant neoplasm of breast: Secondary | ICD-10-CM | POA: Diagnosis not present

## 2021-06-01 ENCOUNTER — Telehealth: Payer: Self-pay | Admitting: Internal Medicine

## 2021-06-01 NOTE — Telephone Encounter (Signed)
Called and spoke with patient regarding her CT scan. Patient states that she is not sure if she still needs this scan. States she will reach out to Dr. Chase Caller when she gets back in town. Patients follow up appointment was already cancelled.   Nothing further needed at this time.

## 2021-06-06 ENCOUNTER — Ambulatory Visit: Payer: Self-pay | Admitting: Internal Medicine

## 2021-06-07 DIAGNOSIS — Z681 Body mass index (BMI) 19 or less, adult: Secondary | ICD-10-CM | POA: Diagnosis not present

## 2021-06-07 DIAGNOSIS — Z760 Encounter for issue of repeat prescription: Secondary | ICD-10-CM | POA: Diagnosis not present

## 2021-06-07 DIAGNOSIS — Z3040 Encounter for surveillance of contraceptives, unspecified: Secondary | ICD-10-CM | POA: Diagnosis not present

## 2021-06-07 DIAGNOSIS — Z1239 Encounter for other screening for malignant neoplasm of breast: Secondary | ICD-10-CM | POA: Diagnosis not present

## 2021-06-07 DIAGNOSIS — N952 Postmenopausal atrophic vaginitis: Secondary | ICD-10-CM | POA: Diagnosis not present

## 2021-06-07 DIAGNOSIS — Z1211 Encounter for screening for malignant neoplasm of colon: Secondary | ICD-10-CM | POA: Diagnosis not present

## 2021-06-07 DIAGNOSIS — Z01411 Encounter for gynecological examination (general) (routine) with abnormal findings: Secondary | ICD-10-CM | POA: Diagnosis not present

## 2021-06-29 ENCOUNTER — Other Ambulatory Visit: Payer: 59

## 2021-06-30 ENCOUNTER — Other Ambulatory Visit: Payer: Self-pay

## 2021-06-30 ENCOUNTER — Ambulatory Visit (INDEPENDENT_AMBULATORY_CARE_PROVIDER_SITE_OTHER)
Admission: RE | Admit: 2021-06-30 | Discharge: 2021-06-30 | Disposition: A | Discharge status: Home / Self Care | Payer: 59 | Source: Ambulatory Visit | Attending: Internal Medicine | Admitting: Internal Medicine

## 2021-06-30 DIAGNOSIS — J449 Chronic obstructive pulmonary disease, unspecified: Secondary | ICD-10-CM | POA: Diagnosis not present

## 2021-06-30 DIAGNOSIS — J189 Pneumonia, unspecified organism: Secondary | ICD-10-CM | POA: Diagnosis not present

## 2021-07-11 ENCOUNTER — Encounter: Payer: Self-pay | Admitting: Internal Medicine

## 2021-07-11 ENCOUNTER — Other Ambulatory Visit: Payer: Self-pay

## 2021-07-11 ENCOUNTER — Ambulatory Visit: Payer: 59 | Admitting: Internal Medicine

## 2021-07-11 VITALS — BP 108/64 | HR 76 | Temp 97.8°F | Ht 63.5 in | Wt 111.4 lb

## 2021-07-11 DIAGNOSIS — R0989 Other specified symptoms and signs involving the circulatory and respiratory systems: Secondary | ICD-10-CM | POA: Diagnosis not present

## 2021-07-11 DIAGNOSIS — Z8616 Personal history of COVID-19: Secondary | ICD-10-CM

## 2021-07-11 DIAGNOSIS — R918 Other nonspecific abnormal finding of lung field: Secondary | ICD-10-CM

## 2021-07-11 DIAGNOSIS — Z801 Family history of malignant neoplasm of trachea, bronchus and lung: Secondary | ICD-10-CM | POA: Diagnosis not present

## 2021-07-11 DIAGNOSIS — Z87898 Personal history of other specified conditions: Secondary | ICD-10-CM

## 2021-07-11 DIAGNOSIS — Z8709 Personal history of other diseases of the respiratory system: Secondary | ICD-10-CM

## 2021-07-11 DIAGNOSIS — Z7185 Encounter for immunization safety counseling: Secondary | ICD-10-CM | POA: Diagnosis not present

## 2021-07-11 DIAGNOSIS — Z23 Encounter for immunization: Secondary | ICD-10-CM | POA: Diagnosis not present

## 2021-07-11 NOTE — Patient Instructions (Addendum)
ICD-10-CM   1. Pulmonary nodules  R91.8 IgG, IgA, IgM    IgE    Resp Allergy Profile Regn2DC DE MD [place]    QuantiFERON-TB Gold Plus    Sjogren's syndrome antibods(ssa + ssb)    Pulmonary function test    ANA    Rheumatoid factor    Cyclic citrul peptide antibody, IgG    Anti-DNA antibody, double-stranded    Anti-DNA antibody, double-stranded    Cyclic citrul peptide antibody, IgG    Rheumatoid factor    ANA    Sjogren's syndrome antibods(ssa + ssb)    QuantiFERON-TB Gold Plus    Resp Allergy Profile Regn2DC DE MD [place]    IgE    IgG, IgA, IgM    2. Abnormal findings on diagnostic imaging of lung  R91.8 IgG, IgA, IgM    IgE    Resp Allergy Profile Regn2DC DE MD [place]    QuantiFERON-TB Gold Plus    Sjogren's syndrome antibods(ssa + ssb)    Pulmonary function test    ANA    Rheumatoid factor    Cyclic citrul peptide antibody, IgG    Anti-DNA antibody, double-stranded    Anti-DNA antibody, double-stranded    Cyclic citrul peptide antibody, IgG    Rheumatoid factor    ANA    Sjogren's syndrome antibods(ssa + ssb)    QuantiFERON-TB Gold Plus    Resp Allergy Profile Regn2DC DE MD [place]    IgE    IgG, IgA, IgM    3. Chronic throat clearing  R09.89 IgG, IgA, IgM    IgE    Resp Allergy Profile Regn2DC DE MD [place]    QuantiFERON-TB Gold Plus    Sjogren's syndrome antibods(ssa + ssb)    Pulmonary function test    ANA    Rheumatoid factor    Cyclic citrul peptide antibody, IgG    Anti-DNA antibody, double-stranded    Anti-DNA antibody, double-stranded    Cyclic citrul peptide antibody, IgG    Rheumatoid factor    ANA    Sjogren's syndrome antibods(ssa + ssb)    QuantiFERON-TB Gold Plus    Resp Allergy Profile Regn2DC DE MD [place]    IgE    IgG, IgA, IgM    4. History of wheezing  Z87.898 IgG, IgA, IgM    IgE    Resp Allergy Profile Regn2DC DE MD [place]    QuantiFERON-TB Gold Plus    Sjogren's syndrome antibods(ssa + ssb)    Pulmonary function test    ANA     Rheumatoid factor    Cyclic citrul peptide antibody, IgG    Anti-DNA antibody, double-stranded    Anti-DNA antibody, double-stranded    Cyclic citrul peptide antibody, IgG    Rheumatoid factor    ANA    Sjogren's syndrome antibods(ssa + ssb)    QuantiFERON-TB Gold Plus    Resp Allergy Profile Regn2DC DE MD [place]    IgE    IgG, IgA, IgM    5. History of acute bronchitis  Z87.09 IgG, IgA, IgM    IgE    Resp Allergy Profile Regn2DC DE MD [place]    QuantiFERON-TB Gold Plus    Sjogren's syndrome antibods(ssa + ssb)    Pulmonary function test    ANA    Rheumatoid factor    Cyclic citrul peptide antibody, IgG    Anti-DNA antibody, double-stranded    Anti-DNA antibody, double-stranded    Cyclic citrul peptide antibody,  IgG    Rheumatoid factor    ANA    Sjogren's syndrome antibods(ssa + ssb)    QuantiFERON-TB Gold Plus    Resp Allergy Profile Regn2DC DE [place]    IgE    IgG, IgA, IgM    6. History of 2019 novel coronavirus disease (COVID-19)  Z86.16 IgG, IgA, IgM    IgE    Resp Allergy Profile Regn2DC DE MD [place]    QuantiFERON-TB Gold Plus    Sjogren's syndrome antibods(ssa + ssb)    Pulmonary function test    ANA    Rheumatoid factor    Cyclic citrul peptide antibody, IgG    Anti-DNA antibody, double-stranded    Anti-DNA antibody, double-stranded    Cyclic citrul peptide antibody, IgG    Rheumatoid factor    ANA    Sjogren's syndrome antibods(ssa + ssb)    QuantiFERON-TB Gold Plus    Resp Allergy Profile Regn2DC DE MD [place]    IgE    IgG, IgA, IgM    7. Flu vaccine need  Z23     8. Vaccine counseling  Z71.85     9. Need for immunization against influenza  Z23 Flu Vaccine QUAD 55mo+IM (Fluarix, Fluzone & Alfiuria Quad PF)    10. Family history of lung cancer  Z80.1       Unclear to me if nodules have progressed in 2 years or not  - kind of looks stable to me Glad overall better/stable other than occassional throat clearing and wheezing Noted 2 -3 episodes of  respiratory infections past 2 years including one with long chronic cough No evidence of lung cancer as of Oct 2022  Plan  - will get opinion on CT from thoracic radiology about CT scan nodules  - initiate workup for allergic asthma and pulmnary nodules and bronchitis/wheeze history  - do blood IgG, IgM, and IgA, and Ig E  - do blood rast allergy profile  - do cbc with diff  - do Quantiferon Gold blood test  - do ANA, RF, CCP, DS-DNA, ssa/ssb,  - repeat spirometry with and wihout BD and dlco - flu shot 07/11/2021 - hold off on covid mRNA bivalent booster to Omicron/BA.5 because you have natural immunity to it from covid sept 2022  - can hold off definitely for 3 months and maybe upto a year  - monitor for new strain and vaccines to new strain  - 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

## 2021-07-11 NOTE — Progress Notes (Signed)
OV 07/14/2019  Subjective:  Patient ID: Kelsey Greer, female , DOB: [birth year] , age 55 y.o. , MRN: [id number] , ADDRESS: [place] [id number]   07/14/2019 -   Chief Complaint  Patient presents with   Pulmonary Consult    Referred for Abnormal CT scan.      HPI Kelsey Greer 55 y.o. -female   She presents for evaluation of pulmonary nodules.  This is the first visit.  The initial first visit was supposed to be in March 2020 following her CT scan but with the onset of the pandemic this was shelved.  She tells me that for many years she is to have recurrent winter bronchitis/respiratory infection and this would happen on an annual basis.  In 2010 it was significant enough that she coughed for several weeks/few months.  She almost canceled her trip to [place] where she went hiking.  She was then treated with antibiotics plus minus steroids and the cough resolved and this allowed her to go to [place].  She was able to hike well at that time.  Then in 2016 she was visiting [place].  Her daughter was in college at that time.  She found mold in the living space and spent some time cleaning it.  After that she got acutely short of breath in the night associated with some chest heaviness particularly on the right side.  She checked herself into the ER and was apparently told she might have "fungal pneumonia".  She recollects having had a CT scan at that time.  Symptoms subsequently resolved.  She did have a follow-up CT scan of the chest in 2016 in [place] and this showed a possible left lower lobe groundglass opacity.  She was instructed to follow-up on this because of concern of potential malignancy.  However she did not follow-up on this till her March 2020 scan which showed different findings of micro nodularity [multiple].  She tells me that after her 2016 episode she has been quite well.  She exercises regularly.  She lives physically fit and  healthy.  She has not had any symptoms of the respiratory tract including cough wheezing chest tightness and shortness of breath or hemoptysis.  Except prior to her CT scan in March 2020 in the days leading up to it while walking she did have one episode of shortness of breath but since then she is not had any problems.  She lives in [place] in an older home.  She moved into this home 2 years ago.  After moving and she discovered mold in the basement where her laundry machines are.  The summer 2020 she spent time cleaning it but for the last 2 years she periodically enters the space to do her laundry.  Other than this there is no known mold exposure.  She is worried she might have mold related pulmonary infection.  She does have some night sweats but she thinks this is menopause related but otherwise no other symptoms.  Other than that she endorses a chronic postnasal drip which she says many of her friends suffer from.  She is recently been asked to take Claritin which helps partially but she does not take it all the time.  Early in the morning she has to cough and clear her throat.  She is wondering if the postnasal drip is somewhat contributory to her pulmonary findings.   She is interested in knowing the agencies that can come and inspect  the mold level in a house and give her some cleaning advised.   CLINICAL DATA:  Follow-up pulmonary nodules   EXAM: CT CHEST WITHOUT CONTRAST from October r 2020 that I personally visualized   TECHNIQUE: Multidetector CT imaging of the chest was performed following the standard protocol without IV contrast.   COMPARISON:  CT chest dated 11/28/2018   FINDINGS: Cardiovascular: The heart is normal in size. No pericardial effusion.   No evidence of thoracic aortic aneurysm.   Mediastinum/Nodes: No suspicious mediastinal lymphadenopathy.   Visualized thyroid is unremarkable.   Lungs/Pleura: Biapical pleural-parenchymal scarring.   Mild clustered  peribronchovascular nodularity in the posterior right upper lobe (series 3/image 55), anterior/inferior right upper lobe (series 3/image 83), lateral right middle lobe (series 3/image 105), lingula (series 3/image 107), and lateral right lower lobe (series 3/image 118). This appearance is similar to the prior, favoring atypical mycobacterial infection.   No focal consolidation.   No pleural effusion or pneumothorax.   Upper Abdomen: Visualized upper abdomen is notable for small hepatic cysts.   Musculoskeletal: Visualized osseous structures are within normal limits.   IMPRESSION: Mild clustered peribronchovascular nodularity scattered in the lungs bilaterally, unchanged from the prior, favoring chronic atypical mycobacterial infection.   No suspicious pulmonary nodules.     Electronically Signed   By: Julian Hy M.D.   On: 07/11/2019 10:59      OV 07/11/2021  Subjective:  Patient ID: Kelsey Greer, female , DOB: 09/18/1966 , age 89 y.o. , MRN: [id number] , ADDRESS: [place] [id number] PCP Lavone Orn, MD Patient Care Team: Lavone Orn, MD as PCP - General (Internal Medicine)  This Provider for this visit: Treatment Team:  Attending Provider: Brand Males, MD    07/11/2021 -   Chief Complaint  Patient presents with   Follow-up    Pt is here to discuss results of recent CT.  Pt states she has been doing okay since last visit.     HPI Kelsey Greer 55 y.o. -here to follow-up for her right upper lobe micronodular infiltrates that is suggestive of MAC.  She also has had intermittent clearing of the throat and intermittently exams that it showed fluctuating wheezing.  She gives  a detailed chronnology of her lung issue   ? June 2016 - Pneumonia- upper right lung. - Don't remember being sick. Was visiting [place], [place] and developed sharp pain upper right lung. Pneumonia cleared up. (Noticed a  small ground glass nodule on lower left lobe. Radiologist said prob fine, but follow up later if concerned).  ? March 2020 - Followed up on ground glass nodule (I was concerned about what Covid would do). CT scan at [facility] on [facility]. NO change in nodule, but had small clusters in both lungs. Mild asymptomatic microbial infection possible. Called Dr. Chase Caller [facility]monology) and he looked at scan. Said to wait and rescan in 6 months.  ? October 2020- Follow up CT scan. Still seeing small white clusters in both lungs. Possibility of MAC but hard to determine. (one radiologist thought [place], another said probably not and Camie Patience (Radiologist friend) said that is the most likely explanation. I had a wheeze at the time too so did a breathing test. (It was normal).  ?? December 2020 - I clear my throat a lot so I was referred to an ENT - Dr. Carol Ada at [facility]. Dr. Joya Gaskins put tube down nose and said everything looked normal. He said he saw lots of  saliva in the back of my throat which is not normal. (I was numbed and when he was waiting for it to work, I could not feel myself swallow so that could be why there was excess saliva). He recommended Barium Swallow test to see if any issues swallowing.  ? January 2021 - Barium Swallow test at [facility]. All normal. A little slower with graham cracker, but went down fine when I took a sip of water.     Sept 2021 - [place] trip. No issue  ? May - July 2022 - Sick with fever, chills headache etc. felt like flu or bad cold.  I haven't had any issues for 2 years, until I got a cold/virus in May. ? May 6 th tested for Covid/Flu/RSV - all negative (Did a video call with PA. She said can go on docycyclin. I took it for 2 days. Dr. Laurann Montana said it probably wouldn't help because it's a virus. I stopped taking. (Noticed a little rash too) ? May 17 th - Saw Dr. Laurann Montana - told him I was coughing all the time and my upper lungs hurt. I was worried it was  pneumonia. He listened and said they were clear. Said I should feel better by end of weekend. ? June 6 th - Called Doctor again - still coughing. Gave me prescription for Z pack. Cough went away, cough went away and I felt better. ? June 13-27 th - felt fine- no cough. Even went for a few runs. No problem breathing at all. ? June 28 th - Felt sharp pain in upper left lung. I was helping my daughter move into her new house in [place] - painted (latex only- no strong fumes). I started coughing a little bit over weekend. (Coincidentally, I had 1 st pneumonia in [place] in 2016- something in the air??) ? June 30 th - Chest x ray confirmed pneumonia.  [Dr. Chase Caller does not have this x-ray for visualization on this visit 07/11/2021].  Took 10 days of levofloxacin. No pain after 5 th day of antibiotic. Finished prescription on July 9 th . No phlegm or cough at all. ? July 12 - Watching - a little post nasal drip. Used Flonase   ? August 30 th week - set up CT scan and follow up with Dr. Chase Caller. Want to check pneumonia clearing up and MAC results.    May 23, 2021: Added had COVID-19.  She did not take any antiviral.  She just had a mild cold and a mild cough.  And then felt well and recovered fast.  She wanted to know if she should take the COVID by Valent mRNA booster.     Curently:  -She is feeling great.  She walks around 3.5 miles per day.  No shortness of breath.  No cough no chest pains.  She is able to play pickle ball without any problems.  But occasionally she will intermittently clear her throat and occasionally have wheezing.  She feels this is related to the allergy season and it comes and goes but it is very mild.  -She is also concerned about a family history.  Her mom died from lung cancer and so did her maternal aunt.  Both her maternal grandparents also died from lung cancer.  She says all of them smoked heavily.  She has never smoked.    -She is worried  about the emerging respiratory virus season's.  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  I personally visualized this.  Also short of the images.  Radiologist reporting the right upper lobe anterior segment infiltrates to be slightly worse compared to a few years ago.  She is surprised with this she does not feel this.  In my personal visualization opinion I do not think it is any worse compared to 2 years ago.  We decided to get a thoracic radiology opinion on this.  CT Chest data 06/29/2021  CLINICAL DATA:  Follow-up pneumonia.   EXAM: CT CHEST WITHOUT CONTRAST   TECHNIQUE: Multidetector CT imaging of the chest was performed following the standard protocol without IV contrast.   COMPARISON:  07/11/2019   FINDINGS: Cardiovascular:  No acute findings.   Mediastinum/Nodes: No masses or pathologically enlarged lymph nodes identified on this unenhanced exam.   Lungs/Pleura: Stable biapical pleural-parenchymal scarring. Clustered areas of peribronchovascular nodularity are again seen in the posterior right upper lobe, lateral right middle lobe, and anterior right lower lobe. This shows mild progression in the anterior right lower lobe since prior study. No evidence of pulmonary airspace disease or pleural effusion.   Upper Abdomen:  Stable small fluid attenuation hepatic cysts.   Musculoskeletal:  No suspicious bone lesions.   IMPRESSION: Clustered areas of peribronchovascular nodularity in the right lung are again seen, with mild progression noted in the anterior right lower lobe. 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   No evidence of mass or lymphadenopathy.     Electronically Signed   By: Marlaine Hind M.D.   On: 07/01/2021 13:36    No results found.    PFT  PFT Results Latest Ref Rng & Units 07/25/2019  FVC-Pre L 3.07  FVC-Predicted Pre % 90  FVC-Post L 3.16   FVC-Predicted Post % 93  Pre FEV1/FVC % % 80  Post FEV1/FCV % % 82  FEV1-Pre L 2.47  FEV1-Predicted Pre % 92  FEV1-Post L 2.60  DLCO uncorrected ml/min/mmHg 25.72  DLCO UNC% % 125  DLVA Predicted % 124  TLC L 5.74  TLC % Predicted % 114  RV % Predicted % 141       has a past medical history of Dental crowns present and Papilloma of left breast (05/2014).   reports that she has never smoked. She has never used smokeless tobacco.  Past Surgical History:  Procedure Laterality Date   BREAST EXCISIONAL BIOPSY Left 2015   COMBINED HYSTEROSCOPY DIAGNOSTIC / D&C  06/29/2005   with removal of endometrial polyps   ENDOMETRIAL ABLATION W/ NOVASURE     GYNECOLOGIC CRYOSURGERY  1989    Allergies  Allergen Reactions   Etodolac Rash   Penicillins Rash    Immunization History  Administered Date(s) Administered   Influenza,inj,Quad PF,6+ Mos 10/11/2015, 07/14/2019, 07/11/2021   Influenza,inj,quad, With Preservative 09/19/2015   PFIZER(Purple Top)SARS-COV-2 Vaccination 12/15/2019    Family History  Problem Relation Age of Onset   Cancer Maternal Aunt    Breast cancer Maternal Aunt    Cancer Maternal Grandmother    Cancer Maternal Grandfather    Cancer Paternal Grandmother    Breast cancer Paternal Aunt     No current outpatient medications on file.      Objective:   Vitals:   07/11/21 1008  BP: 108/64  Pulse: 76  Temp: 97.8 F (36.6 C)  TempSrc: Oral  SpO2: 98%  Weight: 111 lb 6.4 oz (50.5 kg)  Height: 5' 3.5" (1.613  m)    Estimated body mass index is 19.42 kg/m as calculated from the following:   Height as of this encounter: 5' 3.5" (1.613 m).   Weight as of this encounter: 111 lb 6.4 oz (50.5 kg).  _0 @  Filed Weights   07/11/21 1008  Weight: 111 lb 6.4 oz (50.5 kg)     Physical Exam    General: No distress. Looks well Neuro: Alert and Oriented x 3. GCS 15. Speech normal Psych: Pleasant Resp:  Barrel Chest - no.  Wheeze - no,  Crackles - no, No overt respiratory distress CVS: Normal heart sounds. Murmurs - no Ext: Stigmata of Connective Tissue Disease - no HEENT: Normal upper airway. PEERL +. No post nasal drip        Assessment:       ICD-10-CM   1. Pulmonary nodules  R91.8 IgG, IgA, IgM    IgE    Resp Allergy Profile Regn2DC DE MD [place]    QuantiFERON-TB Gold Plus    Sjogren's syndrome antibods(ssa + ssb)    Pulmonary function test    ANA    Rheumatoid factor    Cyclic citrul peptide antibody, IgG    Anti-DNA antibody, double-stranded    Anti-DNA antibody, double-stranded    Cyclic citrul peptide antibody, IgG    Rheumatoid factor    ANA    Sjogren's syndrome antibods(ssa + ssb)    QuantiFERON-TB Gold Plus    Resp Allergy Profile Regn2DC DE MD [place]    IgE    IgG, IgA, IgM    2. Abnormal findings on diagnostic imaging of lung  R91.8 IgG, IgA, IgM    IgE    Resp Allergy Profile Regn2DC DE MD [place]    QuantiFERON-TB Gold Plus    Sjogren's syndrome antibods(ssa + ssb)    Pulmonary function test    ANA    Rheumatoid factor    Cyclic citrul peptide antibody, IgG    Anti-DNA antibody, double-stranded    Anti-DNA antibody, double-stranded    Cyclic citrul peptide antibody, IgG    Rheumatoid factor    ANA    Sjogren's syndrome antibods(ssa + ssb)    QuantiFERON-TB Gold Plus    Resp Allergy Profile Regn2DC DE [place]    IgE    IgG, IgA, IgM    3. Chronic throat clearing  R09.89 IgG, IgA, IgM    IgE    Resp Allergy Profile Regn2DC DE MD [place]    QuantiFERON-TB Gold Plus    Sjogren's syndrome antibods(ssa + ssb)    Pulmonary function test    ANA    Rheumatoid factor    Cyclic citrul peptide antibody, IgG    Anti-DNA antibody, double-stranded    Anti-DNA antibody, double-stranded    Cyclic citrul peptide antibody, IgG    Rheumatoid factor    ANA    Sjogren's syndrome antibods(ssa + ssb)    QuantiFERON-TB Gold Plus    Resp Allergy Profile Regn2DC DE MD [place]    IgE    IgG, IgA,  IgM    4. History of wheezing  Z87.898 IgG, IgA, IgM    IgE    Resp Allergy Profile Regn2DC DE MD [place]    QuantiFERON-TB Gold Plus    Sjogren's syndrome antibods(ssa + ssb)    Pulmonary function test    ANA    Rheumatoid factor    Cyclic citrul peptide antibody, IgG    Anti-DNA antibody, double-stranded  Anti-DNA antibody, double-stranded    Cyclic citrul peptide antibody, IgG    Rheumatoid factor    ANA    Sjogren's syndrome antibods(ssa + ssb)    QuantiFERON-TB Gold Plus    Resp Allergy Profile Regn2DC DE MD [place]    IgE    IgG, IgA, IgM    5. History of acute bronchitis  Z87.09 IgG, IgA, IgM    IgE    Resp Allergy Profile Regn2DC DE MD [place]    QuantiFERON-TB Gold Plus    Sjogren's syndrome antibods(ssa + ssb)    Pulmonary function test    ANA    Rheumatoid factor    Cyclic citrul peptide antibody, IgG    Anti-DNA antibody, double-stranded    Anti-DNA antibody, double-stranded    Cyclic citrul peptide antibody, IgG    Rheumatoid factor    ANA    Sjogren's syndrome antibods(ssa + ssb)    QuantiFERON-TB Gold Plus    Resp Allergy Profile Regn2DC DE MD [place]    IgE    IgG, IgA, IgM    6. History of 2019 novel coronavirus disease (COVID-19)  Z86.16 IgG, IgA, IgM    IgE    Resp Allergy Profile Regn2DC DE MD [place]    QuantiFERON-TB Gold Plus    Sjogren's syndrome antibods(ssa + ssb)    Pulmonary function test    ANA    Rheumatoid factor    Cyclic citrul peptide antibody, IgG    Anti-DNA antibody, double-stranded    Anti-DNA antibody, double-stranded    Cyclic citrul peptide antibody, IgG    Rheumatoid factor    ANA    Sjogren's syndrome antibods(ssa + ssb)    QuantiFERON-TB Gold Plus    Resp Allergy Profile Regn2DC DE MD [place]    IgE    IgG, IgA, IgM    7. Flu vaccine need  Z23     8. Vaccine counseling  Z71.85     9. Need for immunization against influenza  Z23 Flu Vaccine QUAD 43moIM (Fluarix, Fluzone & Alfiuria Quad PF)    10. Family history of  lung cancer  Z80.1       CT scan does not show any evidence of cancer.  Despite a strong family history of cancer with her non-smoking and 2 CT scans not showing lung cancer I think the risk of future lung cancer is very low.  The current nodules to me are stable.  The micronodules.  This certainly suggest MAC but I did explain to her that my personal professional experience many times will need to do bronchoscopy on this we do not identify MAC and so we do not know the etiology.  Did explain that if there is MAC we will treat when there is significant symptoms or progression either through radiology or through pulmonary function testing.  Did explain the long duration of antimicrobial therapy for MAC infection and the side effects it entails but did explain that most people who are functionally fit and not over 7510 to tolerate the antituberculous medications better.  She is concerned about the occasional throat irritation intermittent wheezing which likely is because of vocal cord irritation. She is also concerned about the etiology for these nodules.  Did offer to her getting some investigations done through blood test and she is willing to go through this.  We can evaluate for allergy profile, immunoglobulin deficiency and autoimmune serology to see if these are etiologies contributing to these nodules and the symptoms.  He can also monitor for progression through repeat pulmonary function testing.  Meanwhile I have also written to thoracic radiology Dr. Eddie Candle to get his opinion on progression of the infiltrates.  We discussed COVID mRNA by Valent booster.  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  Advised that she could wait few to several months before reassessing.  Meanwhile avoid respiratory infections through masking and avoiding clustering.  She will have a flu shot today.     Plan:     Patient Instructions     ICD-10-CM   1. Pulmonary nodules   R91.8 IgG, IgA, IgM    IgE    Resp Allergy Profile Regn2DC DE MD [place]    QuantiFERON-TB Gold Plus    Sjogren's syndrome antibods(ssa + ssb)    Pulmonary function test    ANA    Rheumatoid factor    Cyclic citrul peptide antibody, IgG    Anti-DNA antibody, double-stranded    Anti-DNA antibody, double-stranded    Cyclic citrul peptide antibody, IgG    Rheumatoid factor    ANA    Sjogren's syndrome antibods(ssa + ssb)    QuantiFERON-TB Gold Plus    Resp Allergy Profile Regn2DC DE MD [place]    IgE    IgG, IgA, IgM    2. Abnormal findings on diagnostic imaging of lung  R91.8 IgG, IgA, IgM    IgE    Resp Allergy Profile Regn2DC DE MD [place]    QuantiFERON-TB Gold Plus    Sjogren's syndrome antibods(ssa + ssb)    Pulmonary function test    ANA    Rheumatoid factor    Cyclic citrul peptide antibody, IgG    Anti-DNA antibody, double-stranded    Anti-DNA antibody, double-stranded    Cyclic citrul peptide antibody, IgG    Rheumatoid factor    ANA    Sjogren's syndrome antibods(ssa + ssb)    QuantiFERON-TB Gold Plus    Resp Allergy Profile Regn2DC DE MD [place]    IgE    IgG, IgA, IgM    3. Chronic throat clearing  R09.89 IgG, IgA, IgM    IgE    Resp Allergy Profile Regn2DC DE MD [place]    QuantiFERON-TB Gold Plus    Sjogren's syndrome antibods(ssa + ssb)    Pulmonary function test    ANA    Rheumatoid factor    Cyclic citrul peptide antibody, IgG    Anti-DNA antibody, double-stranded    Anti-DNA antibody, double-stranded    Cyclic citrul peptide antibody, IgG    Rheumatoid factor    ANA    Sjogren's syndrome antibods(ssa + ssb)    QuantiFERON-TB Gold Plus    Resp Allergy Profile Regn2DC DE MD [place]    IgE    IgG, IgA, IgM    4. History of wheezing  Z87.898 IgG, IgA, IgM    IgE    Resp Allergy Profile Regn2DC DE MD [place]    QuantiFERON-TB Gold Plus    Sjogren's syndrome antibods(ssa + ssb)    Pulmonary function test    ANA    Rheumatoid factor    Cyclic citrul  peptide antibody, IgG    Anti-DNA antibody, double-stranded    Anti-DNA antibody, double-stranded    Cyclic citrul peptide antibody, IgG    Rheumatoid factor    ANA    Sjogren's syndrome antibods(ssa + ssb)    QuantiFERON-TB Gold Plus    Resp Allergy  Profile Regn2DC DE MD [place] [facility]    IgE    IgG, IgA, IgM    5. History of acute bronchitis  Z87.09 IgG, IgA, IgM    IgE    Resp Allergy Profile Regn2DC DE MD [place]    QuantiFERON-TB Gold Plus    Sjogren's syndrome antibods(ssa + ssb)    Pulmonary function test    ANA    Rheumatoid factor    Cyclic citrul peptide antibody, IgG    Anti-DNA antibody, double-stranded    Anti-DNA antibody, double-stranded    Cyclic citrul peptide antibody, IgG    Rheumatoid factor    ANA    Sjogren's syndrome antibods(ssa + ssb)    QuantiFERON-TB Gold Plus    Resp Allergy Profile Regn2DC DE MD [place]    IgE    IgG, IgA, IgM    6. History of 2019 novel coronavirus disease (COVID-19)  Z86.16 IgG, IgA, IgM    IgE    Resp Allergy Profile Regn2DC DE MD [place] [facility]    QuantiFERON-TB Gold Plus    Sjogren's syndrome antibods(ssa + ssb)    Pulmonary function test    ANA    Rheumatoid factor    Cyclic citrul peptide antibody, IgG    Anti-DNA antibody, double-stranded    Anti-DNA antibody, double-stranded    Cyclic citrul peptide antibody, IgG    Rheumatoid factor    ANA    Sjogren's syndrome antibods(ssa + ssb)    QuantiFERON-TB Gold Plus    Resp Allergy Profile Regn2DC DE MD [place]    IgE    IgG, IgA, IgM    7. Flu vaccine need  Z23     8. Vaccine counseling  Z71.85     9. Need for immunization against influenza  Z23 Flu Vaccine QUAD 77moIM (Fluarix, Fluzone & Alfiuria Quad PF)    10. Family history of lung cancer  Z80.1       Unclear to me if nodules have progressed in 2 years or not  - kind of looks stable to me Glad overall better/stable other than occassional throat clearing and wheezing Noted 2 -3 episodes of respiratory infections past 2 years  including one with long chronic cough No evidence of lung cancer as of Oct 2022  Plan  - will get opinion on CT from thoracic radiology about CT scan nodules  - initiate workup for allergic asthma and pulmnary nodules and bronchitis/wheeze history  - do blood IgG, IgM, and IgA, and Ig E  - do blood rast allergy profile  - do cbc with diff  - do Quantiferon Gold blood test  - do ANA, RF, CCP, DS-DNA, ssa/ssb,  - repeat spirometry with and wihout BD and dlco - flu shot 07/11/2021 - hold off on covid mRNA bivalent booster to Omicron/BA.5 because you have natural immunity to it from covid sept 2022  - can hold off definitely for 3 months and maybe upto a year  - monitor for new strain and vaccines to new strain  - 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 It excludes time spent by the [place] or office staff in the care of the patient. Actual time 51 min)   SIGNATURE    Dr. Brand Males, M.D., F.C.C.P,  Pulmonary and Critical Care Medicine Staff Physician, [place] Director - Interstitial Lung Disease  Program  Pulmonary [place] at [place], [place], [address]  Pager: [phone], If no answer or between  15:00h - 7:00h: call [phone] Telephone: [phone]  6:34 PM 07/11/2021

## 2021-07-12 LAB — RESPIRATORY ALLERGY PROFILE REGION II ~~LOC~~
Allergen, A. alternata, m6: 0.1 kU/L — ABNORMAL HIGH
Allergen, Cedar tree, t12: 1.29 kU/L — ABNORMAL HIGH
Allergen, Comm Silver Birch, t9: 0.27 kU/L — ABNORMAL HIGH
Allergen, Cottonwood, t14: <0.1 kU/L
Allergen, D pternoyssinus,d7: 0.18 kU/L — ABNORMAL HIGH
Allergen, Mouse Urine Protein, e78: 0.11 kU/L — ABNORMAL HIGH
Allergen, Mulberry, t76: 0.2 kU/L — ABNORMAL HIGH
Allergen, Oak,t7: 0.16 kU/L — ABNORMAL HIGH
Allergen, P. notatum, m1: <0.1 kU/L
Aspergillus fumigatus, m3: <0.1 kU/L
Bermuda Grass: 0.15 kU/L — ABNORMAL HIGH
Box Elder IgE: 0.13 kU/L — ABNORMAL HIGH
CLADOSPORIUM HERBARUM (M2) IGE: 0.13 kU/L — ABNORMAL HIGH
COMMON RAGWEED (SHORT) (W1) IGE: 0.39 kU/L — ABNORMAL HIGH
Cat Dander: 0.11 kU/L — ABNORMAL HIGH
Class: 0
Class: 0
Class: 0
Class: 0
Class: 0
Class: 1
Class: 2
Cockroach: 0.19 kU/L — ABNORMAL HIGH
D. farinae: 0.16 kU/L — ABNORMAL HIGH
Dog Dander: 0.13 kU/L — ABNORMAL HIGH
Elm IgE: 0.12 kU/L — ABNORMAL HIGH
IgE (Immunoglobulin E), Serum: 11 kU/L (ref <=114)
Johnson Grass: <0.1 kU/L
Pecan/Hickory Tree IgE: 0.11 kU/L — ABNORMAL HIGH
Rough Pigweed  IgE: <0.1 kU/L
Sheep Sorrel IgE: 0.11 kU/L — ABNORMAL HIGH
Timothy Grass: 0.12 kU/L — ABNORMAL HIGH

## 2021-07-12 LAB — IGG, IGA, IGM
IgG (Immunoglobin G), Serum: 724 mg/dL (ref 600–1640)
IgM, Serum: 102 mg/dL (ref 50–300)
Immunoglobulin A: 99 mg/dL (ref 47–310)

## 2021-07-12 LAB — IGE: IgE (Immunoglobulin E), Serum: 14 kU/L (ref <=114)

## 2021-07-12 LAB — CYCLIC CITRUL PEPTIDE ANTIBODY, IGG: Cyclic Citrullin Peptide Ab: <16 UNITS

## 2021-07-12 LAB — SJOGREN'S SYNDROME ANTIBODS(SSA + SSB)
SSA (Ro) (ENA) Antibody, IgG: <1 AI
SSB (La) (ENA) Antibody, IgG: <1 AI

## 2021-07-12 LAB — ANA: Anti Nuclear Antibody (ANA): NEGATIVE

## 2021-07-12 LAB — RHEUMATOID FACTOR: Rheumatoid fact SerPl-aCnc: <14 IU/mL (ref <14)

## 2021-07-12 LAB — ANTI-DNA ANTIBODY, DOUBLE-STRANDED: ds DNA Ab: 2 IU/mL

## 2021-07-12 LAB — INTERPRETATION:

## 2021-07-14 LAB — QUANTIFERON-TB GOLD PLUS
Mitogen-NIL: >10 IU/mL
NIL: 0.03 IU/mL
QuantiFERON-TB Gold Plus: NEGATIVE
TB1-NIL: 0.01 IU/mL
TB2-NIL: 0 IU/mL

## 2021-08-29 ENCOUNTER — Other Ambulatory Visit: Payer: Self-pay

## 2021-08-29 ENCOUNTER — Ambulatory Visit: Payer: 59 | Admitting: Internal Medicine

## 2021-08-29 ENCOUNTER — Encounter: Payer: Self-pay | Admitting: Internal Medicine

## 2021-08-29 ENCOUNTER — Ambulatory Visit (INDEPENDENT_AMBULATORY_CARE_PROVIDER_SITE_OTHER): Payer: 59 | Admitting: Internal Medicine

## 2021-08-29 VITALS — BP 108/70 | HR 70 | Temp 97.8°F | Ht 63.0 in | Wt 115.0 lb

## 2021-08-29 DIAGNOSIS — Z8616 Personal history of COVID-19: Secondary | ICD-10-CM

## 2021-08-29 DIAGNOSIS — R0989 Other specified symptoms and signs involving the circulatory and respiratory systems: Secondary | ICD-10-CM | POA: Diagnosis not present

## 2021-08-29 DIAGNOSIS — Z8709 Personal history of other diseases of the respiratory system: Secondary | ICD-10-CM

## 2021-08-29 DIAGNOSIS — Z87898 Personal history of other specified conditions: Secondary | ICD-10-CM | POA: Diagnosis not present

## 2021-08-29 DIAGNOSIS — Z9109 Other allergy status, other than to drugs and biological substances: Secondary | ICD-10-CM

## 2021-08-29 DIAGNOSIS — R918 Other nonspecific abnormal finding of lung field: Secondary | ICD-10-CM

## 2021-08-29 LAB — PULMONARY FUNCTION TEST
DL/VA % pred: 116 %
DL/VA: 4.99 ml/min/mmHg/L
DLCO cor % pred: 121 %
DLCO cor: 24.12 ml/min/mmHg
DLCO unc % pred: 121 %
DLCO unc: 24.12 ml/min/mmHg
FEF 25-75 Pre: 2.02 L/sec
FEF2575-%Pred-Pre: 83 %
FEV1-%Pred-Pre: 93 %
FEV1-Pre: 2.38 L
FEV1FVC-%Pred-Pre: 99 %
FEV6-%Pred-Pre: 96 %
FEV6-Pre: 3.04 L
FEV6FVC-%Pred-Pre: 102 %
FVC-%Pred-Pre: 93 %
FVC-Pre: 3.05 L
Pre FEV1/FVC ratio: 78 %
Pre FEV6/FVC Ratio: 99 %

## 2021-08-29 LAB — CBC WITH DIFFERENTIAL/PLATELET
Basophils Absolute: 0 10*3/uL (ref 0.0–0.1)
Basophils Relative: 0.6 % (ref 0.0–3.0)
Eosinophils Absolute: 0.1 10*3/uL (ref 0.0–0.7)
Eosinophils Relative: 1.4 % (ref 0.0–5.0)
HCT: 42.3 % (ref 36.0–46.0)
Hemoglobin: 14.1 g/dL (ref 12.0–15.0)
Lymphocytes Relative: 39.9 % (ref 12.0–46.0)
Lymphs Abs: 2.3 10*3/uL (ref 0.7–4.0)
MCHC: 33.4 g/dL (ref 30.0–36.0)
MCV: 97.7 fl (ref 78.0–100.0)
Monocytes Absolute: 0.4 10*3/uL (ref 0.1–1.0)
Monocytes Relative: 7.5 % (ref 3.0–12.0)
Neutro Abs: 2.9 10*3/uL (ref 1.4–7.7)
Neutrophils Relative %: 50.6 % (ref 43.0–77.0)
Platelets: 250 10*3/uL (ref 150.0–400.0)
RBC: 4.34 Mil/uL (ref 3.87–5.11)
RDW: 13 % (ref 11.5–15.5)
WBC: 5.8 10*3/uL (ref 4.0–10.5)

## 2021-08-29 NOTE — Progress Notes (Signed)
OV 07/14/2019  Subjective:  Patient ID: Kelsey Greer, female , DOB: August 04, 1966 , age 55 y.o. , MRN: [id number] , ADDRESS: [place] [address]   07/14/2019 -   Chief Complaint  Patient presents with   Pulmonary Consult    Referred for Abnormal CT scan.      HPI Kelsey Greer 55 y.o. -female   She presents for evaluation of pulmonary nodules.  This is the first visit.  The initial first visit was supposed to be in March 2020 following her CT scan but with the onset of the pandemic this was shelved.  She tells me that for many years she is to have recurrent winter bronchitis/respiratory infection and this would happen on an annual basis.  In 2010 it was significant enough that she coughed for several weeks/few months.  She almost canceled her trip to [place] where she went hiking.  She was then treated with antibiotics plus minus steroids and the cough resolved and this allowed her to go to [place].  She was able to hike well at that time.  Then in 2016 she was visiting [place].  Her daughter was in college at that time.  She found mold in the living space and spent some time cleaning it.  After that she got acutely short of breath in the night associated with some chest heaviness particularly on the right side.  She checked herself into the ER and was apparently told she might have "fungal pneumonia".  She recollects having had a CT scan at that time.  Symptoms subsequently resolved.  She did have a follow-up CT scan of the chest in 2016 in [place] and this showed a possible left lower lobe groundglass opacity.  She was instructed to follow-up on this because of concern of potential malignancy.  However she did not follow-up on this till her March 2020 scan which showed different findings of micro nodularity [multiple].  She tells me that after her 2016 episode she has been quite well.  She exercises regularly.  She lives physically fit and  healthy.  She has not had any symptoms of the respiratory tract including cough wheezing chest tightness and shortness of breath or hemoptysis.  Except prior to her CT scan in March 2020 in the days leading up to it while walking she did have one episode of shortness of breath but since then she is not had any problems.  She lives in [place] in an older home.  She moved into this home 2 years ago.  After moving and she discovered mold in the basement where her laundry machines are.  The summer 2020 she spent time cleaning it but for the last 2 years she periodically enters the space to do her laundry.  Other than this there is no known mold exposure.  She is worried she might have mold related pulmonary infection.  She does have some night sweats but she thinks this is menopause related but otherwise no other symptoms.  Other than that she endorses a chronic postnasal drip which she says many of her friends suffer from.  She is recently been asked to take Claritin which helps partially but she does not take it all the time.  Early in the morning she has to cough and clear her throat.  She is wondering if the postnasal drip is somewhat contributory to her pulmonary findings.   She is interested in knowing the agencies that can come and inspect the  mold level in a house and give her some cleaning advised.   CLINICAL DATA:  Follow-up pulmonary nodules   EXAM: CT CHEST WITHOUT CONTRAST from October r 2020 that I personally visualized   TECHNIQUE: Multidetector CT imaging of the chest was performed following the standard protocol without IV contrast.   COMPARISON:  CT chest dated 11/28/2018   FINDINGS: Cardiovascular: The heart is normal in size. No pericardial effusion.   No evidence of thoracic aortic aneurysm.   Mediastinum/Nodes: No suspicious mediastinal lymphadenopathy.   Visualized thyroid is unremarkable.   Lungs/Pleura: Biapical pleural-parenchymal scarring.   Mild clustered  peribronchovascular nodularity in the posterior right upper lobe (series 3/image 55), anterior/inferior right upper lobe (series 3/image 83), lateral right middle lobe (series 3/image 105), lingula (series 3/image 107), and lateral right lower lobe (series 3/image 118). This appearance is similar to the prior, favoring atypical mycobacterial infection.   No focal consolidation.   No pleural effusion or pneumothorax.   Upper Abdomen: Visualized upper abdomen is notable for small hepatic cysts.   Musculoskeletal: Visualized osseous structures are within normal limits.   IMPRESSION: Mild clustered peribronchovascular nodularity scattered in the lungs bilaterally, unchanged from the prior, favoring chronic atypical mycobacterial infection.   No suspicious pulmonary nodules.     Electronically Signed   By: Julian Hy M.D.   On: 07/11/2019 10:59      OV 07/11/2021  Subjective:  Patient ID: Kelsey Greer, female , DOB: 1966/07/15 , age 70 y.o. , MRN: [id number] , ADDRESS: [place] [id number] PCP Lavone Orn, MD Patient Care Team: Lavone Orn, MD as PCP - General (Internal Medicine)  This Provider for this visit: Treatment Team:  Attending Provider: Brand Males, MD    07/11/2021 -   Chief Complaint  Patient presents with   Follow-up    Pt is here to discuss results of recent CT.  Pt states she has been doing okay since last visit.     HPI Kelsey Greer 55 y.o. -here to follow-up for her right upper lobe micronodular infiltrates that is suggestive of MAC.  She also has had intermittent clearing of the throat and intermittently exams that it showed fluctuating wheezing.  She gives  a detailed chronnology of her lung issue   ? June 2016 - Pneumonia- upper right lung. - Don't remember being sick. Was visiting [place], [place] and developed sharp pain upper right lung. Pneumonia cleared up. (Noticed a  small ground glass nodule on lower left lobe. Radiologist said prob fine, but follow up later if concerned).  ? March 2020 - Followed up on ground glass nodule (I was concerned about what Covid would do). CT scan at [facility] on 11/27/18. NO change in nodule, but had small clusters in both lungs. Mild asymptomatic microbial infection possible. Called Dr. Chase Caller [facility] Pulmon[facility]) and he looked at scan. Said to wait and rescan in 6 months.  ? October 2020- Follow up CT scan. Still seeing small white clusters in both lungs. Possibility of MAC but hard to determine. (one radiologist thought [place], another said probably not and Camie Patience (Radiologist friend) said that is the most likely explanation. I had a wheeze at the time too so did a breathing test. (It was normal).  ?? December 2020 - I clear my throat a lot so I was referred to an ENT - Dr. Carol Ada at [facility]. Dr. Joya Gaskins put tube down nose and said everything looked normal. He said he saw lots of saliva  in the back of my throat which is not normal. (I was numbed and when he was waiting for it to work, I could not feel myself swallow so that could be why there was excess saliva). He recommended Barium Swallow test to see if any issues swallowing.  ? January 2021 - Barium Swallow test at [facility]. All normal. A little slower with graham cracker, but went down fine when I took a sip of water.     Sept 2021 - [place] trip. No issue  ? May - July 2022 - Sick with fever, chills headache etc. felt like flu or bad cold.  I haven't had any issues for 2 years, until I got a cold/virus in May. ? May 6 th tested for Covid/Flu/RSV - all negative (Did a video call with PA. She said can go on docycyclin. I took it for 2 days. Dr. Laurann Montana said it probably wouldn't help because it's a virus. I stopped taking. (Noticed a little rash too) ? May 17 th - Saw Dr. Laurann Montana - told him I was coughing all the time and my upper lungs hurt. I was worried it was  pneumonia. He listened and said they were clear. Said I should feel better by end of weekend. ? June 6 th - Called Doctor again - still coughing. Gave me prescription for Z pack. Cough went away, cough went away and I felt better. ? June 13-27 th - felt fine- no cough. Even went for a few runs. No problem breathing at all. ? June 28 th - Felt sharp pain in upper left lung. I was helping my daughter move into her new house in [place] - painted (latex only- no strong fumes). I started coughing a little bit over weekend. (Coincidentally, I had 1 st pneumonia in [place] in 2016- something in the air??) ? June 30 th - Chest x ray confirmed pneumonia.  [Dr. Chase Caller does not have this x-ray for visualization on this visit 07/11/2021].  Took 10 days of levofloxacin. No pain after 5 th day of antibiotic. Finished prescription on July 9 th . No phlegm or cough at all. ? July 12 - Watching - a little post nasal drip. Used Flonase   ? August 30 th week - set up CT scan and follow up with Dr. Chase Caller. Want to check pneumonia clearing up and MAC results.    May 23, 2021: Added had COVID-19.  She did not take any antiviral.  She just had a mild cold and a mild cough.  And then felt well and recovered fast.  She wanted to know if she should take the COVID by Valent mRNA booster.     Curently:  -She is feeling great.  She walks around 3.5 miles per day.  No shortness of breath.  No cough no chest pains.  She is able to play pickle ball without any problems.  But occasionally she will intermittently clear her throat and occasionally have wheezing.  She feels this is related to the allergy season and it comes and goes but it is very mild.  -She is also concerned about a family history.  Her mom died from lung cancer and so did her maternal aunt.  Both her maternal grandparents also died from lung cancer.  She says all of them smoked heavily.  She has never smoked.    -She is worried  about the emerging respiratory virus season's.  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  I personally visualized this.  Also short of the images.  Radiologist reporting the right upper lobe anterior segment infiltrates to be slightly worse compared to a few years ago.  She is surprised with this she does not feel this.  In my personal visualization opinion I do not think it is any worse compared to 2 years ago.  We decided to get a thoracic radiology opinion on this.  CT Chest data 06/29/2021  CLINICAL DATA:  Follow-up pneumonia.   EXAM: CT CHEST WITHOUT CONTRAST   TECHNIQUE: Multidetector CT imaging of the chest was performed following the standard protocol without IV contrast.   COMPARISON:  07/11/2019   FINDINGS: Cardiovascular:  No acute findings.   Mediastinum/Nodes: No masses or pathologically enlarged lymph nodes identified on this unenhanced exam.   Lungs/Pleura: Stable biapical pleural-parenchymal scarring. Clustered areas of peribronchovascular nodularity are again seen in the posterior right upper lobe, lateral right middle lobe, and anterior right lower lobe. This shows mild progression in the anterior right lower lobe since prior study. No evidence of pulmonary airspace disease or pleural effusion.   Upper Abdomen:  Stable small fluid attenuation hepatic cysts.   Musculoskeletal:  No suspicious bone lesions.   IMPRESSION: Clustered areas of peribronchovascular nodularity in the right lung are again seen, with mild progression noted in the anterior right lower lobe. 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   No evidence of mass or lymphadenopathy.     Electronically Signed   By: Marlaine Hind M.D.   On: 07/01/2021 13:36  2nd opinion from Dr Toma Aran to patient: " I just reviewed your study from 10/13 and it's minimally changed from 07/11/19.  A little worse in the  right upper lobe, better in the right middle lobe. Was read by a body imager who is good, and I agree with his report."  No results found.    OV 08/29/2021  Subjective:  Patient ID: Kelsey Greer, female , DOB: 1966/04/28 , age 39 y.o. , MRN: [id number] , ADDRESS: [place] [id number] PCP Lavone Orn, MD Patient Care Team: Lavone Orn, MD as PCP - General (Internal Medicine)  This Provider for this visit: Treatment Team:  Attending Provider: Brand Males, MD    08/29/2021 -   Chief Complaint  Patient presents with   Follow-up    PFT performed today. Pt states she has been doing good since last visit and denies any complaints.   -here to follow-up for her right upper lobe micronodular infiltrates that is suggestive of MAC.  She also has had intermittent clearing of the throat and intermittently exams that it showed fluctuating wheezing  Hx outpatinet COVID - sept 2022  HPI Kelsey Greer 55 y.o. -returns for follow-up to discuss his symptoms and also investigation..  Results of investigation showed her autoimmune profile is negative.  Immunoglobulin level is normal.  QuantiFERON gold is normal.  However has significant positive allergens on the RAST blood allergy testing.  For some reason I do not see a CBC with differential for her eosinophil count.  She is agreed to get this done today.  In terms of the lung nodule: [place]   took a second opinion on the CT from another radiologist.  It appears there is fluctuance to the nodules with some words and some better.  She had lung function test today and it is normal.  She is  beginning to feel better.  Her symptoms are very minimal.  She says she is a lot better.  Her clearing of the throat is very rare and is limited to early in the morning and occasionally phlegm.  She is puzzled by her recurrent pneumonia.  We did discuss the fact that 1 time it was following exposure to mold when  she was in [place].  Did express the fact that given her significant positivity for allergy testing that she might be extra sensitive to respiratory viruses and taking time to recover from it.  She believes she could be having seasonal allergy issues.  At this time she wants to manage with an expectant approach but clearly if she is getting worse or her symptoms are recurring then she will take the help of an allergist.  In terms of her pulmonary nodules: This is fluctuant/essentially stable.  Pulmonary function test is normal.  Symptoms are better.  Therefore we discussed option of doing bronchoscopy now versus following along.  We took a shared decision making to take an expectant approach.  We will do a pulmonary function test in 6 months and reassess.  She is agreeable to this approach.  In terms of the nodules: We discussed the possibility of radiation exposure with frequent CT scans.  Agree to hold off doing more aggressive testing would reassess for an annual CT scan in the summer 2023.     Dr Lorenza [place] Reflux Symptom Index (> 13-15 suggestive of LPR cough) 0 -> 5  =  none ->severe problem 08/29/2021   Hoarseness of problem with voice 0  Clearing  Of Throat 1  Excess throat mucus or feeling of post nasal drip 0.5  Difficulty swallowing food, liquid or tablets 0  Cough after eating or lying down 0  Breathing difficulties or choking episodes 0  Troublesome or annoying cough 0  Sensation of something sticking in throat or lump in throat 00  Heartburn, chest pain, indigestion, or stomach acid coming up 0  TOTAL 1.5        Latest Reference Range & Units 07/11/21 11:12  Allergen, A. alternata, m6 kU/L 0.10 (H)  Allergen, P. notatum, m1 kU/L <0.10  CLADOSPORIUM HERBARUM (M2) IGE kU/L 0.13 (H)  (H): Data is abnormally high   Latest Reference Range & Units 07/11/21 11:12  IgG (Immunoglobin G), Serum 600 - 1,640 mg/dL 724  IgM, Serum 50 - 300 mg/dL 102  Sheep Sorrel IgE kU/L  0.11 (H)  Pecan/Hickory Tree IgE kU/L 0.11 (H)  IgE (Immunoglobulin E), Serum <OR=114 kU/L <OR=114 kU/L 14 11  Allergen, D pternoyssinus,d7 kU/L 0.18 (H)  Cat Dander kU/L 0.11 (H)  Dog Dander kU/L 0.13 (H)  [place] Grass kU/L 0.15 (H)  Johnson Grass kU/L <0.10  Timothy Grass kU/L 0.12 (H)  Cockroach kU/L 0.19 (H)  Aspergillus fumigatus, m3 kU/L <0.10  Allergen, Comm Silver Wendee Copp, t9 kU/L 0.27 (H)  Allergen, Cottonwood, t14 kU/L <0.10  Elm IgE kU/L 0.12 (H)  Allergen, Mulberry, t76 kU/L 0.20 (H)  Allergen, Oak,t7 kU/L 0.16 (H)  COMMON RAGWEED (SHORT) (W1) IGE kU/L 0.39 (H)  Allergen, Mouse Urine Protein, e78 kU/L 0.11 (H)  D. farinae kU/L 0.16 (H)  Allergen, Cedar tree, t12 kU/L 1.29 (H)  Box Elder IgE kU/L 0.13 (H)  Rough Pigweed  IgE kU/L <0.10  (H): Data is abnormally high   Xxxxxxxxxxxxxxxxxxxx   Latest Reference Range & Units 07/11/21 11:12  Anti Nuclear Antibody (ANA) NEGATIVE  NEGATIVE  Cyclic Citrullin Peptide Ab  UNITS <16  ds DNA Ab IU/mL 2  Immunoglobulin A 47 - 310 mg/dL 99  RA Latex Turbid. <14 IU/mL <14  IgG (Immunoglobin G), Serum 600 - 1,640 mg/dL 724  IgE (Immunoglobulin E), Serum <OR=114 kU/L <OR=114 kU/L 14 11  SSA (Ro) (ENA) Antibody, IgG <1.0 NEG AI <1.0 NEG  SSB (La) (ENA) Antibody, IgG <1.0 NEG AI <1.0 NEG    CT Chest data  No results found.   Latest Reference Range & Units 07/11/21 11:12  IgG (Immunoglobin G), Serum 600 - 1,640 mg/dL 724  IgE (Immunoglobulin E), Serum <OR=114 kU/L <OR=114 kU/L 14 11    PFT  PFT Results Latest Ref Rng & Units 08/29/2021 07/25/2019  FVC-Pre L 3.05 3.07  FVC-Predicted Pre % 93 90  FVC-Post L - 3.16  FVC-Predicted Post % - 93  Pre FEV1/FVC % % 78 80  Post FEV1/FCV % % - 82  FEV1-Pre L 2.38 2.47  FEV1-Predicted Pre % 93 92  FEV1-Post L - 2.60  DLCO uncorrected ml/min/mmHg 24.12 25.72  DLCO UNC% % 121 125  DLCO corrected ml/min/mmHg 24.12 -  DLCO COR %Predicted % 121 -  DLVA Predicted % 116 124   TLC L - 5.74  TLC % Predicted % - 114  RV % Predicted % - 141       has a past medical history of Dental crowns present and Papilloma of left breast (05/2014).   reports that she has never smoked. She has never used smokeless tobacco.  Past Surgical History:  Procedure Laterality Date   BREAST EXCISIONAL BIOPSY Left 2015   COMBINED HYSTEROSCOPY DIAGNOSTIC / D&C  06/29/2005   with removal of endometrial polyps   ENDOMETRIAL ABLATION W/ NOVASURE     GYNECOLOGIC CRYOSURGERY  1989    Allergies  Allergen Reactions   Etodolac Rash   Penicillins Rash    Immunization History  Administered Date(s) Administered   Influenza,inj,Quad PF,6+ Mos 10/11/2015, 07/14/2019, 07/11/2021   Influenza,inj,quad, With Preservative 09/19/2015   PFIZER(Purple Top)SARS-COV-2 Vaccination 11/07/2019, 12/15/2019, 08/18/2020    Family History  Problem Relation Age of Onset   Cancer Maternal Aunt    Breast cancer Maternal Aunt    Cancer Maternal Grandmother    Cancer Maternal Grandfather    Cancer Paternal Grandmother    Breast cancer Paternal Aunt     No current outpatient medications on file.      Objective:   Vitals:   08/29/21 0948  BP: 108/70  Pulse: 70  Temp: 97.8 F (36.6 C)  TempSrc: Oral  SpO2: 99%  Weight: 115 lb (52.2 kg)  Height: _0  (1.6 m)    Estimated body mass index is 20.37 kg/m as calculated from the following:   Height as of this encounter: _1  (1.6 m).   Weight as of this encounter: 115 lb (52.2 kg).  _2 @  [facility] Weights   08/29/21 0948  Weight: 115 lb (52.2 kg)     Physical Exam    General: No distress. Looks well Neuro: Alert and Oriented x 3. GCS 15. Speech normal Psych: Pleasant Resp:  Barrel Chest - no.  Wheeze - no, Crackles - no, No overt respiratory distress CVS: Normal heart sounds. Murmurs - no Ext: Stigmata of Connective Tissue Disease - no HEENT: Normal upper airway. PEERL +. No post nasal drip        Assessment:        ICD-10-CM   1. Pulmonary nodules  R91.8     2. Chronic throat  clearing  R09.89     3. History of wheezing  Z87.898     4. Environmental allergies  Z91.09          Plan:     Patient Instructions     ICD-10-CM   1. Pulmonary nodules  R91.8     2. Chronic throat clearing  R09.89     3. History of wheezing  Z87.898     4. Environmental allergies  Z91.09       Pulmonary nodules  - give minimal symptoms and stabilty in breathing test plan is expectant approach  Plan  - consider CT chest OCt/Nov 2023 but we can decide this at next visit  Chronic throat clearing History of wheezing Environmental allergies  Noted 2 -3 episodes of respiratory infections past 2 years including one with long chronic cough  Blood test significiantly positive for various environmental allerges but otherwise autoimmune normal   - this suggests one mechanism why seasons, or why you have prolonged symptoms after viral infections  Glad you are improved now  Plan  - - do cbc with diff 08/29/2021 - expectant  approach  - allergy referral if needed based on course - avoid all respiratory infections by masking in crowds, clusters, 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 Brand Males, M.D., F.C.C.P,  Pulmonary and Critical Care Medicine Staff Physician, [place] Director - Interstitial Lung Disease  Program  Pulmonary [place] at [place], [place], [address]  Pager: [phone], If no answer or between  15:00h - 7:00h: call [phone] Telephone: [phone]  10:29 AM 08/29/2021

## 2021-08-29 NOTE — Progress Notes (Signed)
Spiro/ DLCO completed today  

## 2021-08-29 NOTE — Patient Instructions (Addendum)
ICD-10-CM   1. Pulmonary nodules  R91.8     2. Chronic throat clearing  R09.89     3. History of wheezing  Z87.898     4. Environmental allergies  Z91.09       Pulmonary nodules  - give minimal symptoms and stabilty in breathing test plan is expectant approach  Plan  - consider CT chest OCt/Nov 2023 but we can decide this at next visit  Chronic throat clearing History of wheezing Environmental allergies  Noted 2 -3 episodes of respiratory infections past 2 years including one with long chronic cough  Blood test significiantly positive for various environmental allerges but otherwise autoimmune normal   - this suggests one mechanism why seasons, or why you have prolonged symptoms after viral infections  Glad you are improved now  Plan  - - do cbc with diff 08/29/2021 - expectant  approach  - allergy referral if needed based on course - avoid all respiratory infections by masking in crowds, clusters, 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

## 2021-10-19 DIAGNOSIS — D2272 Melanocytic nevi of left lower limb, including hip: Secondary | ICD-10-CM | POA: Diagnosis not present

## 2021-10-19 DIAGNOSIS — L578 Other skin changes due to chronic exposure to nonionizing radiation: Secondary | ICD-10-CM | POA: Diagnosis not present

## 2021-10-19 DIAGNOSIS — M79671 Pain in right foot: Secondary | ICD-10-CM | POA: Diagnosis not present

## 2021-10-19 DIAGNOSIS — Z86018 Personal history of other benign neoplasm: Secondary | ICD-10-CM | POA: Diagnosis not present

## 2021-10-19 DIAGNOSIS — D1801 Hemangioma of skin and subcutaneous tissue: Secondary | ICD-10-CM | POA: Diagnosis not present

## 2021-10-19 DIAGNOSIS — Z23 Encounter for immunization: Secondary | ICD-10-CM | POA: Diagnosis not present

## 2021-10-19 DIAGNOSIS — D2262 Melanocytic nevi of left upper limb, including shoulder: Secondary | ICD-10-CM | POA: Diagnosis not present

## 2021-10-19 DIAGNOSIS — G5761 Lesion of plantar nerve, right lower limb: Secondary | ICD-10-CM | POA: Diagnosis not present

## 2021-10-19 DIAGNOSIS — D225 Melanocytic nevi of trunk: Secondary | ICD-10-CM | POA: Diagnosis not present

## 2021-10-19 DIAGNOSIS — L821 Other seborrheic keratosis: Secondary | ICD-10-CM | POA: Diagnosis not present

## 2022-01-06 ENCOUNTER — Other Ambulatory Visit: Payer: Self-pay | Admitting: Physician Assistant

## 2022-01-06 ENCOUNTER — Telehealth: Payer: Self-pay | Admitting: Internal Medicine

## 2022-01-06 ENCOUNTER — Ambulatory Visit
Admission: RE | Admit: 2022-01-06 | Discharge: 2022-01-06 | Disposition: A | Discharge status: Home / Self Care | Payer: No Typology Code available for payment source | Source: Ambulatory Visit | Attending: Physician Assistant | Admitting: Physician Assistant

## 2022-01-06 DIAGNOSIS — R0902 Hypoxemia: Secondary | ICD-10-CM

## 2022-01-06 DIAGNOSIS — R0602 Shortness of breath: Secondary | ICD-10-CM

## 2022-01-06 DIAGNOSIS — R051 Acute cough: Secondary | ICD-10-CM

## 2022-01-06 DIAGNOSIS — R059 Cough, unspecified: Secondary | ICD-10-CM | POA: Diagnosis not present

## 2022-01-06 NOTE — Telephone Encounter (Signed)
?  Patient emailed me below. I visualized cxr. AGree with findings. ? ?Plan ? - get cxr 2 view in 3-4 weeks ?- based on above - we can time CT chest (last one in oct 2022) ? ? ? ?SIGNATURE  ? ? ?Dr. Brand Males, M.D., F.C.C.P,  ?Pulmonary and Critical Care Medicine ?Staff Physician, [place] ?Center Director - Interstitial Lung Disease  Program  ?Medical Director - [facility] ?[facility] [place] [facility] ?[place], [place], [address] ? ?NPI Number:  NPI #[id number] ?DEA Number: [id number] ? ?Pager: [phone], If no answer  -> Check AMION or Try [phone] ?Telephone (clinical office): [phone] ?Telephone (research): [phone] ? ?6:08 PM ?01/06/2022 ? ? ? ? ?xxxxxxxxxxxxxxxxxxxxxxxxxxx ?Hi [name], ?Unfortunately, I have pneumonia again. My primary shared my chest x ray with you. ?I went in this morning because I still sound congested and had a little pressure in my left lung. They have me on prednisone and 5 days of levofloxican. This is so annoying how a cold/bronchitis is going straight to pneumonia for me. Not sure what else I could have done?Kelsey Greer ?Anyway, I feel ok now, so that?s good. ?Just wanted to share this with you to make sure I?m keeping my lungs as healthy as possible! ?Thanks, ?[name] ? ? ? ?DG Chest 2 View ? ?Result Date: 01/06/2022 ?CLINICAL DATA:  Shortness of breath, cough, hypoxia EXAM: CHEST - 2 VIEW COMPARISON:  Chest radiograph 03/17/2021 FINDINGS: The cardiomediastinal silhouette is normal. There are patchy opacities in both lungs, most notably in the left lower lobe and right midlung. There is no pleural effusion. There is no pneumothorax There is no acute osseous abnormality. IMPRESSION: Patchy opacities in both lungs suspicious for multifocal pneumonia. Recommend follow-up radiographs in 3-4 weeks to assess for resolution. These results will be called to the ordering clinician or representative by the Radiologist Assistant, and  communication documented in the PACS or [employer]. Electronically Signed   By: Valetta Mole M.D.   On: 01/06/2022 11:58   ? ?

## 2022-01-09 NOTE — Telephone Encounter (Signed)
Spoke with the pt and notified of response per MR  ?She verbalized understanding  ?She states that she plans to have cxr with her PCP mid May 2023 and that she already emailed MR letting him know  ?Nothing further needed ?

## 2022-01-31 ENCOUNTER — Ambulatory Visit
Admission: RE | Admit: 2022-01-31 | Discharge: 2022-01-31 | Disposition: A | Discharge status: Home / Self Care | Payer: No Typology Code available for payment source | Source: Ambulatory Visit | Attending: Physician Assistant | Admitting: Physician Assistant

## 2022-01-31 ENCOUNTER — Other Ambulatory Visit: Payer: Self-pay | Admitting: Physician Assistant

## 2022-01-31 DIAGNOSIS — J189 Pneumonia, unspecified organism: Secondary | ICD-10-CM

## 2022-01-31 DIAGNOSIS — Z8701 Personal history of pneumonia (recurrent): Secondary | ICD-10-CM | POA: Diagnosis not present

## 2022-01-31 DIAGNOSIS — R918 Other nonspecific abnormal finding of lung field: Secondary | ICD-10-CM | POA: Diagnosis not present

## 2022-02-07 DIAGNOSIS — L821 Other seborrheic keratosis: Secondary | ICD-10-CM | POA: Diagnosis not present

## 2022-02-07 DIAGNOSIS — D1801 Hemangioma of skin and subcutaneous tissue: Secondary | ICD-10-CM | POA: Diagnosis not present

## 2022-04-17 ENCOUNTER — Other Ambulatory Visit: Payer: Self-pay | Admitting: Obstetrics and Gynecology

## 2022-04-17 DIAGNOSIS — Z1231 Encounter for screening mammogram for malignant neoplasm of breast: Secondary | ICD-10-CM

## 2022-04-19 DIAGNOSIS — L293 Anogenital pruritus, unspecified: Secondary | ICD-10-CM | POA: Diagnosis not present

## 2022-04-19 DIAGNOSIS — L290 Pruritus ani: Secondary | ICD-10-CM | POA: Diagnosis not present

## 2022-05-10 DIAGNOSIS — H43811 Vitreous degeneration, right eye: Secondary | ICD-10-CM | POA: Diagnosis not present

## 2022-06-06 ENCOUNTER — Ambulatory Visit: Payer: No Typology Code available for payment source

## 2022-06-13 ENCOUNTER — Ambulatory Visit: Payer: No Typology Code available for payment source

## 2022-06-15 DIAGNOSIS — Z6820 Body mass index (BMI) 20.0-20.9, adult: Secondary | ICD-10-CM | POA: Diagnosis not present

## 2022-06-15 DIAGNOSIS — Z1211 Encounter for screening for malignant neoplasm of colon: Secondary | ICD-10-CM | POA: Diagnosis not present

## 2022-06-15 DIAGNOSIS — Z1231 Encounter for screening mammogram for malignant neoplasm of breast: Secondary | ICD-10-CM | POA: Diagnosis not present

## 2022-06-15 DIAGNOSIS — Z8262 Family history of osteoporosis: Secondary | ICD-10-CM | POA: Diagnosis not present

## 2022-06-15 DIAGNOSIS — Z01419 Encounter for gynecological examination (general) (routine) without abnormal findings: Secondary | ICD-10-CM | POA: Diagnosis not present

## 2022-06-22 ENCOUNTER — Other Ambulatory Visit: Payer: Self-pay | Admitting: Obstetrics and Gynecology

## 2022-06-22 DIAGNOSIS — N63 Unspecified lump in unspecified breast: Secondary | ICD-10-CM

## 2022-06-22 DIAGNOSIS — H43811 Vitreous degeneration, right eye: Secondary | ICD-10-CM | POA: Diagnosis not present

## 2022-06-26 ENCOUNTER — Ambulatory Visit
Admission: RE | Admit: 2022-06-26 | Discharge: 2022-06-26 | Disposition: A | Discharge status: Home / Self Care | Payer: No Typology Code available for payment source | Source: Ambulatory Visit | Attending: Obstetrics and Gynecology | Admitting: Obstetrics and Gynecology

## 2022-06-26 ENCOUNTER — Other Ambulatory Visit: Payer: Self-pay | Admitting: Obstetrics and Gynecology

## 2022-06-26 DIAGNOSIS — R922 Inconclusive mammogram: Secondary | ICD-10-CM | POA: Diagnosis not present

## 2022-06-26 DIAGNOSIS — N63 Unspecified lump in unspecified breast: Secondary | ICD-10-CM

## 2022-06-26 DIAGNOSIS — N6002 Solitary cyst of left breast: Secondary | ICD-10-CM | POA: Diagnosis not present

## 2022-07-04 DIAGNOSIS — Z23 Encounter for immunization: Secondary | ICD-10-CM | POA: Diagnosis not present

## 2022-07-06 ENCOUNTER — Ambulatory Visit: Payer: No Typology Code available for payment source

## 2022-07-28 ENCOUNTER — Other Ambulatory Visit: Payer: Self-pay | Admitting: Obstetrics and Gynecology

## 2022-07-28 DIAGNOSIS — Z13820 Encounter for screening for osteoporosis: Secondary | ICD-10-CM

## 2022-10-24 DIAGNOSIS — D1801 Hemangioma of skin and subcutaneous tissue: Secondary | ICD-10-CM | POA: Diagnosis not present

## 2022-10-24 DIAGNOSIS — L578 Other skin changes due to chronic exposure to nonionizing radiation: Secondary | ICD-10-CM | POA: Diagnosis not present

## 2022-10-24 DIAGNOSIS — D2262 Melanocytic nevi of left upper limb, including shoulder: Secondary | ICD-10-CM | POA: Diagnosis not present

## 2022-10-24 DIAGNOSIS — D2272 Melanocytic nevi of left lower limb, including hip: Secondary | ICD-10-CM | POA: Diagnosis not present

## 2022-10-24 DIAGNOSIS — Z86018 Personal history of other benign neoplasm: Secondary | ICD-10-CM | POA: Diagnosis not present

## 2022-10-24 DIAGNOSIS — D225 Melanocytic nevi of trunk: Secondary | ICD-10-CM | POA: Diagnosis not present

## 2022-10-24 DIAGNOSIS — L821 Other seborrheic keratosis: Secondary | ICD-10-CM | POA: Diagnosis not present

## 2022-10-24 DIAGNOSIS — L219 Seborrheic dermatitis, unspecified: Secondary | ICD-10-CM | POA: Diagnosis not present

## 2022-11-10 DIAGNOSIS — Z13220 Encounter for screening for lipoid disorders: Secondary | ICD-10-CM | POA: Diagnosis not present

## 2022-11-10 DIAGNOSIS — E559 Vitamin D deficiency, unspecified: Secondary | ICD-10-CM | POA: Diagnosis not present

## 2022-11-10 DIAGNOSIS — Z0000 Encounter for general adult medical examination without abnormal findings: Secondary | ICD-10-CM | POA: Diagnosis not present

## 2023-01-18 ENCOUNTER — Other Ambulatory Visit: Payer: No Typology Code available for payment source

## 2023-04-13 DIAGNOSIS — H43811 Vitreous degeneration, right eye: Secondary | ICD-10-CM | POA: Diagnosis not present

## 2023-04-13 DIAGNOSIS — H5213 Myopia, bilateral: Secondary | ICD-10-CM | POA: Diagnosis not present

## 2023-05-29 ENCOUNTER — Ambulatory Visit
Admission: RE | Admit: 2023-05-29 | Discharge: 2023-05-29 | Disposition: A | Discharge status: Home / Self Care | Payer: No Typology Code available for payment source | Source: Ambulatory Visit | Attending: Obstetrics and Gynecology | Admitting: Obstetrics and Gynecology

## 2023-05-29 DIAGNOSIS — M8588 Other specified disorders of bone density and structure, other site: Secondary | ICD-10-CM | POA: Diagnosis not present

## 2023-05-29 DIAGNOSIS — E349 Endocrine disorder, unspecified: Secondary | ICD-10-CM | POA: Diagnosis not present

## 2023-05-29 DIAGNOSIS — N958 Other specified menopausal and perimenopausal disorders: Secondary | ICD-10-CM | POA: Diagnosis not present

## 2023-05-29 DIAGNOSIS — Z13820 Encounter for screening for osteoporosis: Secondary | ICD-10-CM

## 2023-05-30 ENCOUNTER — Other Ambulatory Visit: Payer: Self-pay | Admitting: Obstetrics and Gynecology

## 2023-05-30 DIAGNOSIS — Z1231 Encounter for screening mammogram for malignant neoplasm of breast: Secondary | ICD-10-CM

## 2023-06-04 ENCOUNTER — Ambulatory Visit: Payer: No Typology Code available for payment source

## 2023-06-20 ENCOUNTER — Ambulatory Visit
Admission: RE | Admit: 2023-06-20 | Discharge: 2023-06-20 | Disposition: A | Discharge status: Home / Self Care | Payer: No Typology Code available for payment source | Source: Ambulatory Visit | Attending: Obstetrics and Gynecology | Admitting: Obstetrics and Gynecology

## 2023-06-20 DIAGNOSIS — Z1231 Encounter for screening mammogram for malignant neoplasm of breast: Secondary | ICD-10-CM

## 2023-06-21 ENCOUNTER — Other Ambulatory Visit: Payer: Self-pay | Admitting: Obstetrics and Gynecology

## 2023-06-21 DIAGNOSIS — R928 Other abnormal and inconclusive findings on diagnostic imaging of breast: Secondary | ICD-10-CM

## 2023-07-03 ENCOUNTER — Ambulatory Visit
Admission: RE | Admit: 2023-07-03 | Discharge: 2023-07-03 | Disposition: A | Discharge status: Home / Self Care | Payer: No Typology Code available for payment source | Source: Ambulatory Visit | Attending: Obstetrics and Gynecology | Admitting: Obstetrics and Gynecology

## 2023-07-03 DIAGNOSIS — R928 Other abnormal and inconclusive findings on diagnostic imaging of breast: Secondary | ICD-10-CM

## 2023-07-03 DIAGNOSIS — N6322 Unspecified lump in the left breast, upper inner quadrant: Secondary | ICD-10-CM | POA: Diagnosis not present

## 2023-07-05 DIAGNOSIS — Z124 Encounter for screening for malignant neoplasm of cervix: Secondary | ICD-10-CM | POA: Diagnosis not present

## 2023-07-05 DIAGNOSIS — Z1330 Encounter for screening examination for mental health and behavioral disorders, unspecified: Secondary | ICD-10-CM | POA: Diagnosis not present

## 2023-07-05 DIAGNOSIS — Z1211 Encounter for screening for malignant neoplasm of colon: Secondary | ICD-10-CM | POA: Diagnosis not present

## 2023-07-05 DIAGNOSIS — Z01419 Encounter for gynecological examination (general) (routine) without abnormal findings: Secondary | ICD-10-CM | POA: Diagnosis not present

## 2023-07-05 DIAGNOSIS — Z6820 Body mass index (BMI) 20.0-20.9, adult: Secondary | ICD-10-CM | POA: Diagnosis not present

## 2023-07-05 DIAGNOSIS — N958 Other specified menopausal and perimenopausal disorders: Secondary | ICD-10-CM | POA: Diagnosis not present

## 2023-07-05 DIAGNOSIS — M8580 Other specified disorders of bone density and structure, unspecified site: Secondary | ICD-10-CM | POA: Diagnosis not present

## 2023-07-05 DIAGNOSIS — Z8262 Family history of osteoporosis: Secondary | ICD-10-CM | POA: Diagnosis not present

## 2023-07-05 DIAGNOSIS — Z1231 Encounter for screening mammogram for malignant neoplasm of breast: Secondary | ICD-10-CM | POA: Diagnosis not present

## 2023-08-13 ENCOUNTER — Telehealth: Payer: Self-pay | Admitting: Internal Medicine

## 2023-08-13 MED ORDER — AZITHROMYCIN 250 MG PO TABS: 250.0000 mg | ORAL_TABLET | Freq: Every day | ORAL | 0 refills | Status: AC

## 2023-08-13 NOTE — Telephone Encounter (Signed)
Called and spoke with pt about Zpak being sent in. Pt verbalized understanding, nfn

## 2023-08-13 NOTE — Telephone Encounter (Signed)
Got a direct call from the patient this morning.  She has had a cold since last Thursday.  No fever there is some hoarseness of voice.  But she is beginning to cough up phlegm.  She is going to get exposed to sick granddaughters this weekend for Thanksgiving.  Z-Pak really works well for her.  Plan - Please call in Z-Pak into [facility]

## 2023-10-15 IMAGING — CR DG CHEST 2V
2 series · 2 of 2 positions shown · non-contrast
Comparison: Chest radiograph 03/17/2021

CLINICAL DATA: Shortness of breath, cough, hypoxia

EXAM:
CHEST - 2 VIEW

[w chest pa]
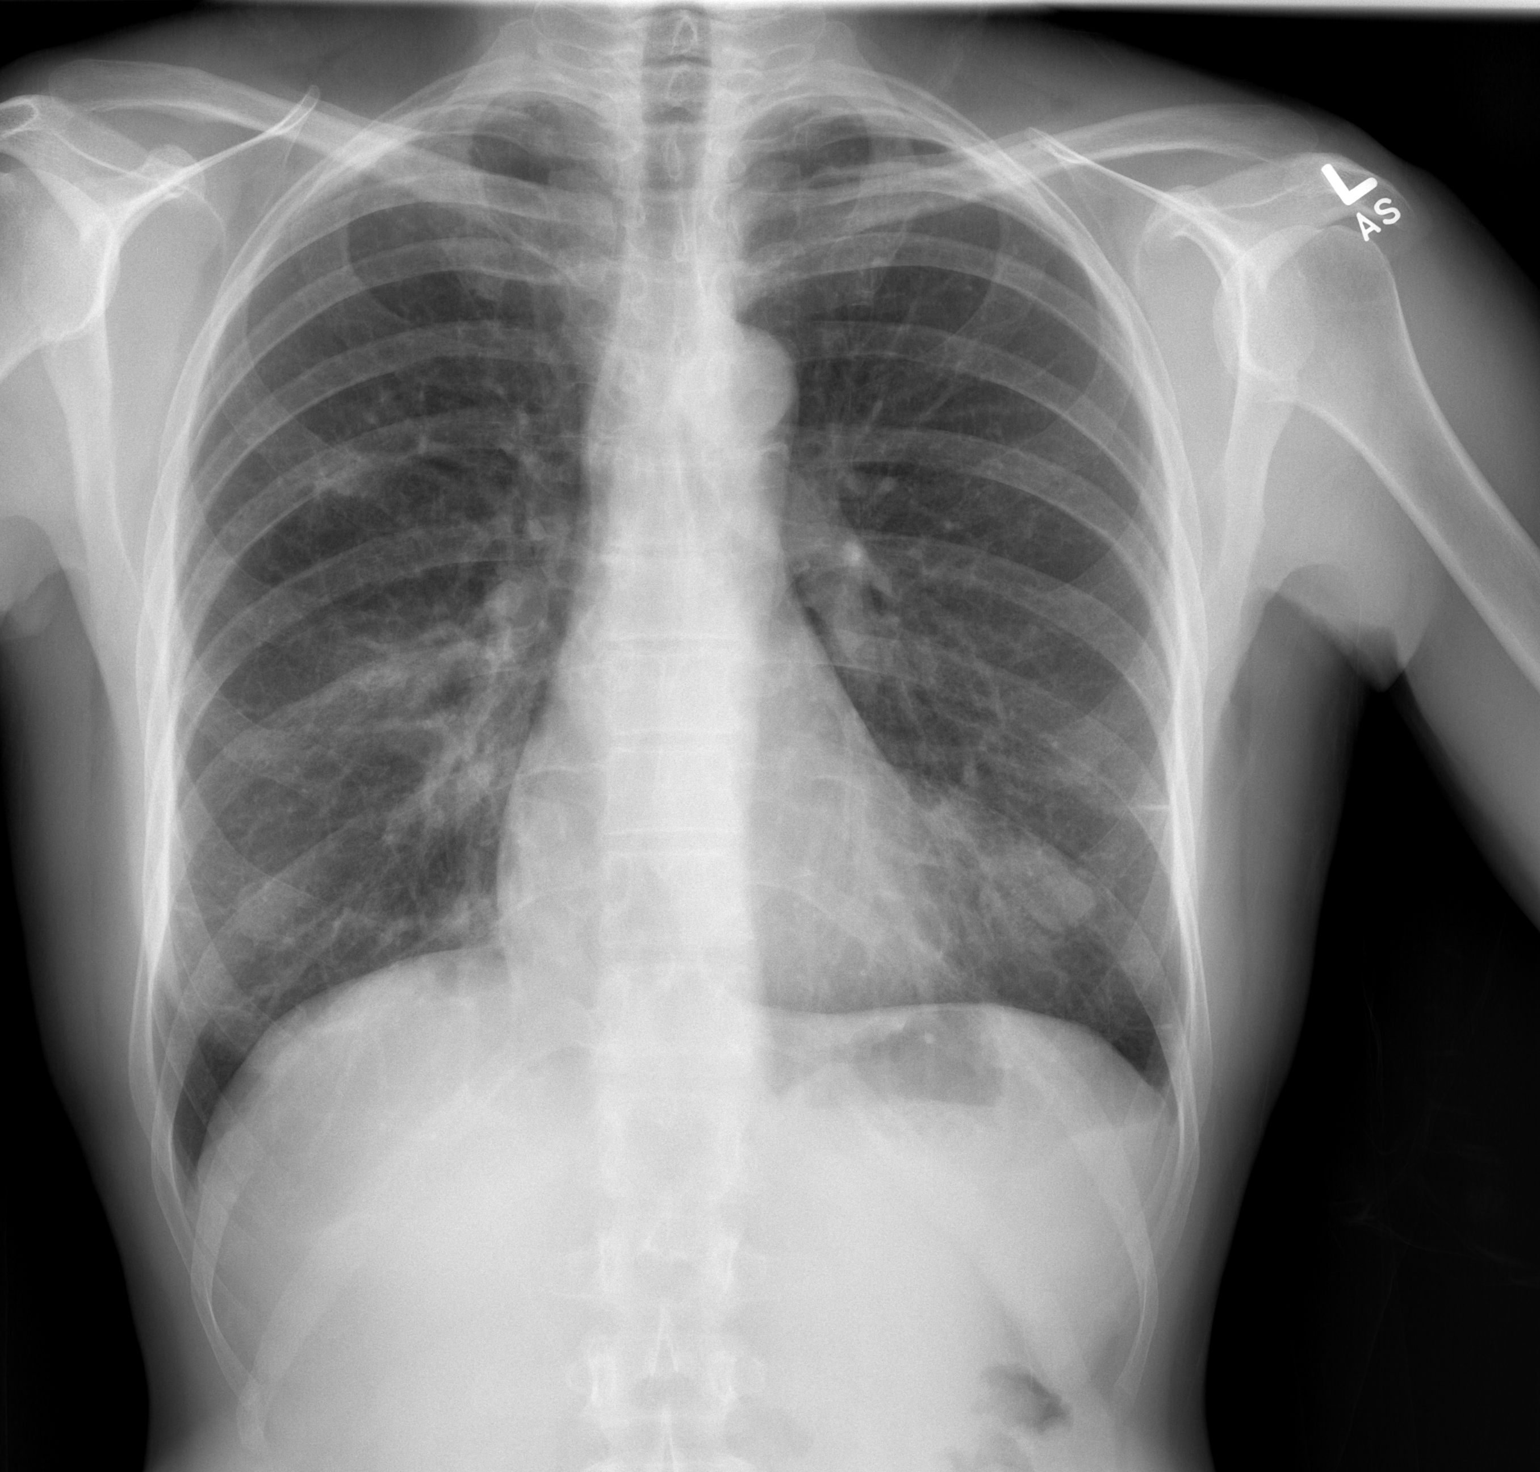

[w chest lat]
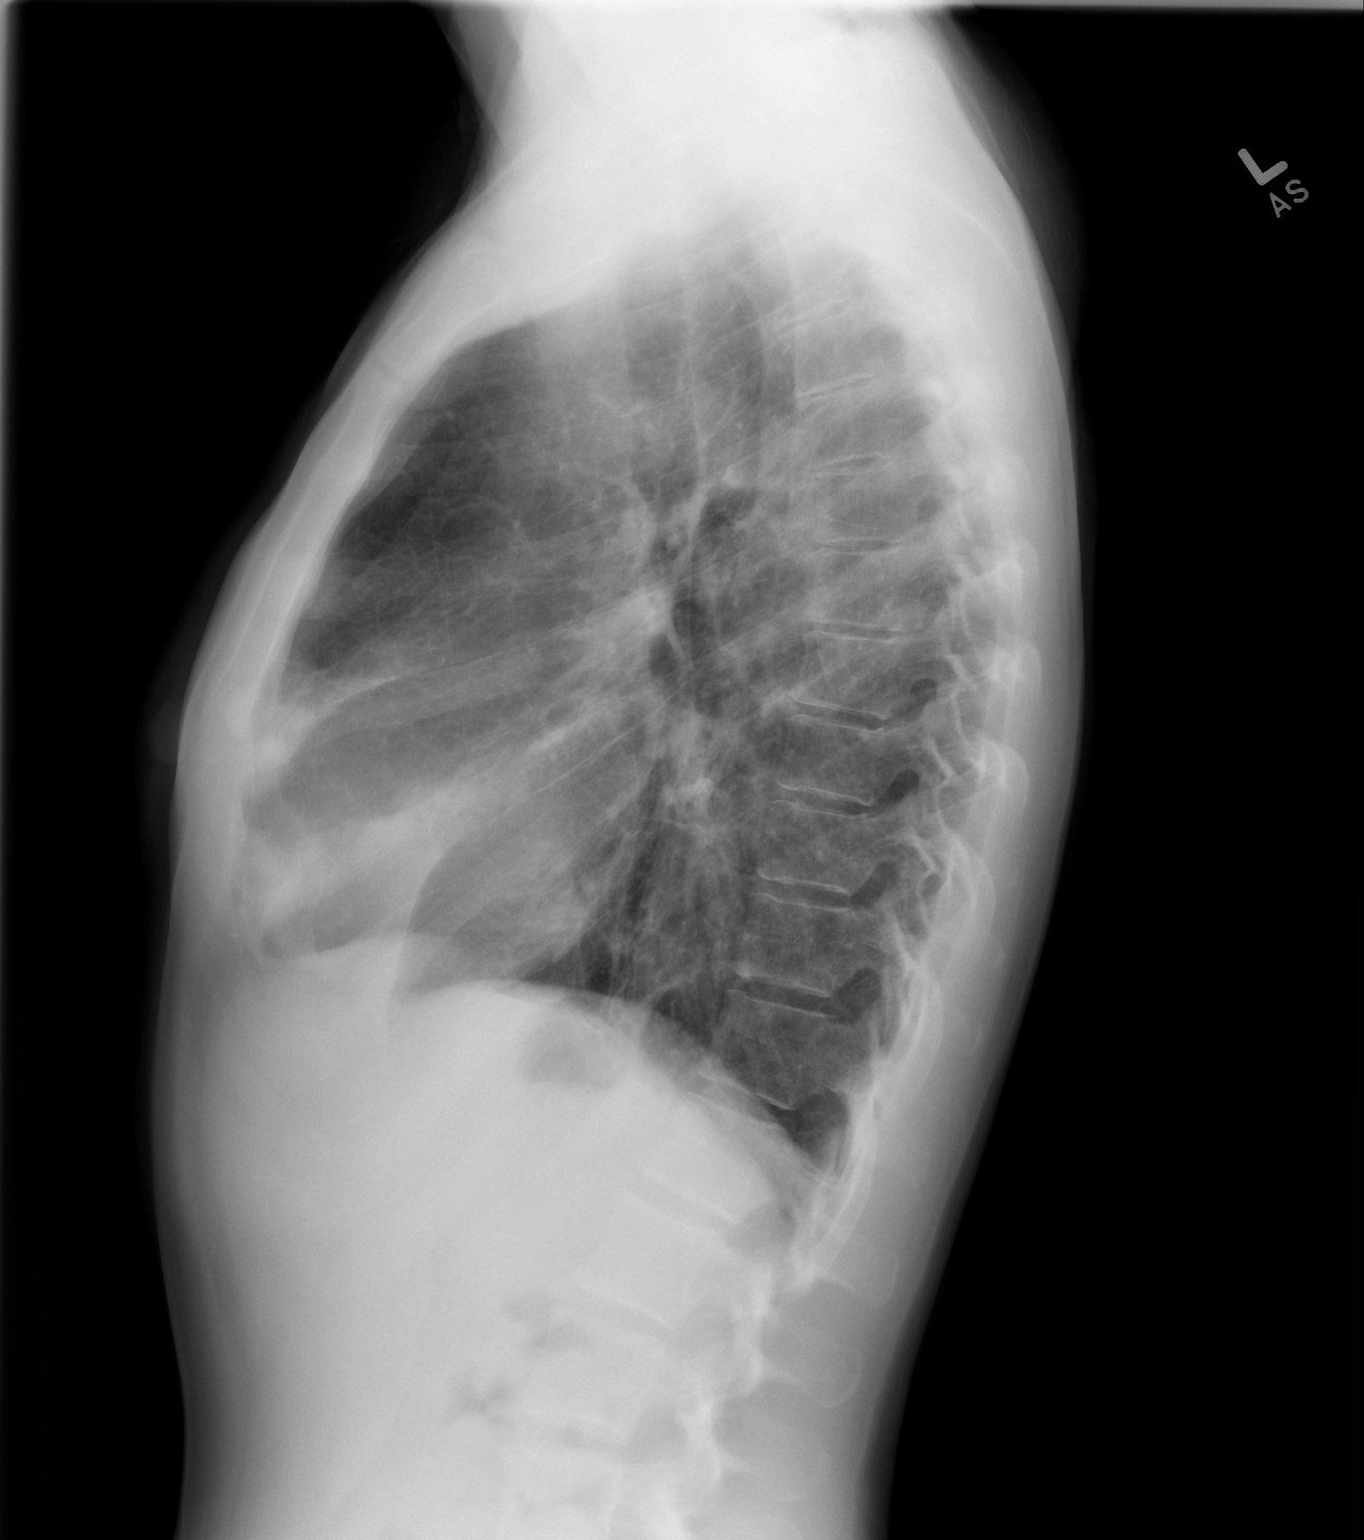

[2 of 2 positions shown; findings below may reference images not displayed]

FINDINGS: The cardiomediastinal silhouette is normal.

There are patchy opacities in both lungs, most notably in the left
lower lobe and right midlung. There is no pleural effusion. There is
no pneumothorax

There is no acute osseous abnormality.
IMPRESSION: Patchy opacities in both lungs suspicious for multifocal pneumonia.
Recommend follow-up radiographs in 3-4 weeks to assess for
resolution.

These results will be called to the ordering clinician or
representative by the Radiologist Assistant, and communication
documented in the PACS or [REDACTED].

## 2023-11-14 ENCOUNTER — Other Ambulatory Visit (HOSPITAL_COMMUNITY): Payer: Self-pay | Admitting: Internal Medicine

## 2023-11-14 DIAGNOSIS — E7800 Pure hypercholesterolemia, unspecified: Secondary | ICD-10-CM

## 2023-11-20 ENCOUNTER — Other Ambulatory Visit (HOSPITAL_COMMUNITY): Payer: Self-pay

## 2023-11-20 ENCOUNTER — Encounter (HOSPITAL_COMMUNITY): Payer: Self-pay

## 2023-11-28 ENCOUNTER — Ambulatory Visit (HOSPITAL_COMMUNITY)
Admission: RE | Admit: 2023-11-28 | Discharge: 2023-11-28 | Disposition: A | Discharge status: Home / Self Care | Payer: Self-pay | Source: Ambulatory Visit | Attending: Internal Medicine | Admitting: Internal Medicine

## 2023-11-28 DIAGNOSIS — E7800 Pure hypercholesterolemia, unspecified: Secondary | ICD-10-CM | POA: Insufficient documentation

## 2023-12-14 ENCOUNTER — Telehealth: Payer: Self-pay | Admitting: Internal Medicine

## 2023-12-14 NOTE — Telephone Encounter (Signed)
 Pls give patient appt in aprl for nodules. Also emailed Kelsey Greer. Opening clinic 4/24 and 4/25. Not tseen in 3 years I think but please double check

## 2023-12-31 ENCOUNTER — Ambulatory Visit: Payer: Self-pay

## 2023-12-31 NOTE — Telephone Encounter (Signed)
 Copied from CRM [id number]. Topic: Appointments - Scheduling Inquiry for Clinic >> Dec 31, 2023  9:50 AM Juliana Ocean wrote: Reason for CRM: pt has to reschedule apt for 4/25.   But states Dr Henri Loft told her he had opened p some dates that could be sxheduled.  Please advise   This Triage RN attempted to contact the patient at this time, no answer, left a voicemail.

## 2023-12-31 NOTE — Telephone Encounter (Signed)
 Called and scheduled pt for 6/24 and added her to wait list.

## 2024-01-02 NOTE — Telephone Encounter (Signed)
 Copied from CRM [id number]. Topic: General - Other >> Dec 31, 2023  5:01 PM Eveleen Hinds B wrote: Reason for CRM: Patient received a call to move appt up to April 28 @ 8:30.  She CAN come in that day. Please confirm if appt is still available. It's okay to leave a message on voicemail.  ATC x1 LVM for patient to call our office back . Will send patient a mychart message .

## 2024-01-11 ENCOUNTER — Ambulatory Visit: Payer: Self-pay | Admitting: Internal Medicine

## 2024-03-04 ENCOUNTER — Other Ambulatory Visit

## 2024-03-11 ENCOUNTER — Encounter: Payer: Self-pay | Admitting: Internal Medicine

## 2024-03-11 ENCOUNTER — Ambulatory Visit (INDEPENDENT_AMBULATORY_CARE_PROVIDER_SITE_OTHER): Payer: Self-pay | Admitting: Internal Medicine

## 2024-03-11 VITALS — BP 106/60 | HR 80 | Ht 63.0 in | Wt 116.0 lb

## 2024-03-11 DIAGNOSIS — R0989 Other specified symptoms and signs involving the circulatory and respiratory systems: Secondary | ICD-10-CM

## 2024-03-11 DIAGNOSIS — R918 Other nonspecific abnormal finding of lung field: Secondary | ICD-10-CM | POA: Diagnosis not present

## 2024-03-11 DIAGNOSIS — Z801 Family history of malignant neoplasm of trachea, bronchus and lung: Secondary | ICD-10-CM | POA: Diagnosis not present

## 2024-03-11 DIAGNOSIS — T7849XA Other allergy, initial encounter: Secondary | ICD-10-CM

## 2024-03-11 DIAGNOSIS — Z7722 Contact with and (suspected) exposure to environmental tobacco smoke (acute) (chronic): Secondary | ICD-10-CM

## 2024-03-11 LAB — CBC WITH DIFFERENTIAL/PLATELET
Basophils Absolute: 0 10*3/uL (ref 0.0–0.1)
Basophils Relative: 0.5 % (ref 0.0–3.0)
Eosinophils Absolute: 0.1 10*3/uL (ref 0.0–0.7)
Eosinophils Relative: 1.5 % (ref 0.0–5.0)
HCT: 42.6 % (ref 36.0–46.0)
Hemoglobin: 14.2 g/dL (ref 12.0–15.0)
Lymphocytes Relative: 39.5 % (ref 12.0–46.0)
Lymphs Abs: 2.3 10*3/uL (ref 0.7–4.0)
MCHC: 33.4 g/dL (ref 30.0–36.0)
MCV: 96.6 fl (ref 78.0–100.0)
Monocytes Absolute: 0.3 10*3/uL (ref 0.1–1.0)
Monocytes Relative: 5.5 % (ref 3.0–12.0)
Neutro Abs: 3.1 10*3/uL (ref 1.4–7.7)
Neutrophils Relative %: 53 % (ref 43.0–77.0)
Platelets: 273 10*3/uL (ref 150.0–400.0)
RBC: 4.42 Mil/uL (ref 3.87–5.11)
RDW: 12.7 % (ref 11.5–15.5)
WBC: 5.8 10*3/uL (ref 4.0–10.5)

## 2024-03-11 NOTE — Patient Instructions (Addendum)
 ICD-10-CM   1. Pulmonary nodules  R91.8     2. Chronic throat clearing  R09.89     3. 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)  T78.49XA         Pulmonary nodules FAmily histoyr of lung cancer  - mother, aunt and grandparents all passed from lung cancer (ALL were heavy smokers)  - There is increased nodularity in the nodules in the right lung in March 2025 compared to 2022 n   Plan  -  CT chest without contrast in next few to several weeks -as a follow-up - recheck Quantitferon gold 03/11/2024 - check cbc with diff 03/11/2024  Chronic throat clearing History of wheezing Environmental allergies -RAST panel positive in 2022   - I think you could be suffering from mild intermittent asthma based on your allergy profile and your history of getting sinus drainage and respiratory exacerbations particularly following viral infections or pollen exposure  Plan  - - do cbc with diff 03/11/2024 - Do AIR SUPRA prn  - TAKE COUpON - expectant  approach  - allergy referral if needed based on course - avoid all respiratory infections by masking in crowds, clusters, and avoiding sick contacts'   Followup - few weeks email/call me for CT results - to decide next step - 6 month routine followu

## 2024-03-11 NOTE — Progress Notes (Signed)
 OV 07/14/2019  Subjective:  Patient ID: [place], female , DOB: 1966-05-18 , age 58 y.o. , MRN: [id number] , ADDRESS: [address] [place] [address]   07/14/2019 -   Chief Complaint  Patient presents with   Pulmonary Consult    Referred for Abnormal CT scan.      HPI [place] 58 y.o. -female   She presents for evaluation of pulmonary nodules.  This is the first visit.  The initial first visit was supposed to be in March 2020 following her CT scan but with the onset of the pandemic this was shelved.  She tells me that for many years she is to have recurrent winter bronchitis/respiratory infection and this would happen on an annual basis.  In 2010 it was significant enough that she coughed for several weeks/few months.  She almost canceled her trip to [place] where she went hiking.  She was then treated with antibiotics plus minus steroids and the cough resolved and this allowed her to go to [place].  She was able to hike well at that time.  Then in 2016 she was visiting [place].  Her daughter was in college at that time.  She found mold in the living space and spent some time cleaning it.  After that she got acutely short of breath in the night associated with some chest heaviness particularly on the right side.  She checked herself into the ER and was apparently told she might have fungal pneumonia.  She recollects having had a CT scan at that time.  Symptoms subsequently resolved.  She did have a follow-up CT scan of the chest in 2016 in [place] and this showed a possible left lower lobe groundglass opacity.  She was instructed to follow-up on this because of concern of potential malignancy.  However she did not follow-up on this till her March 2020 scan which showed different findings of micro nodularity [multiple].  She tells me that after her 2016 episode she has been quite well.  She exercises regularly.  She lives physically fit and  healthy.  She has not had any symptoms of the respiratory tract including cough wheezing chest tightness and shortness of breath or hemoptysis.  Except prior to her CT scan in March 2020 in the days leading up to it while walking she did have one episode of shortness of breath but since then she is not had any problems.  She lives in [place] in an older home.  She moved into this home 2 years ago.  After moving and she discovered mold in the basement where her laundry machines are.  The summer 2020 she spent time cleaning it but for the last 2 years she periodically enters the space to do her laundry.  Other than this there is no known mold exposure.  She is worried she might have mold related pulmonary infection.  She does have some night sweats but she thinks this is menopause related but otherwise no other symptoms.  Other than that she endorses a chronic postnasal drip which she says many of her friends suffer from.  She is recently been asked to take Claritin which helps partially but she does not take it all the time.  Early in the morning she has to cough and clear her throat.  She is wondering if the postnasal drip is somewhat contributory to her pulmonary findings.   She is interested in knowing the agencies that can come and inspect the  mold level in a house and give her some cleaning advised.   CLINICAL DATA:  Follow-up pulmonary nodules   EXAM: CT CHEST WITHOUT CONTRAST from October r 2020 that I personally visualized   TECHNIQUE: Multidetector CT imaging of the chest was performed following the standard protocol without IV contrast.   COMPARISON:  CT chest dated 11/28/2018   FINDINGS: Cardiovascular: The heart is normal in size. No pericardial effusion.   No evidence of thoracic aortic aneurysm.   Mediastinum/Nodes: No suspicious mediastinal lymphadenopathy.   Visualized thyroid is unremarkable.   Lungs/Pleura: Biapical pleural-parenchymal scarring.   Mild clustered  peribronchovascular nodularity in the posterior right upper lobe (series 3/image 55), anterior/inferior right upper lobe (series 3/image 83), lateral right middle lobe (series 3/image 105), lingula (series 3/image 107), and lateral right lower lobe (series 3/image 118). This appearance is similar to the prior, favoring atypical mycobacterial infection.   No focal consolidation.   No pleural effusion or pneumothorax.   Upper Abdomen: Visualized upper abdomen is notable for small hepatic cysts.   Musculoskeletal: Visualized osseous structures are within normal limits.   IMPRESSION: Mild clustered peribronchovascular nodularity scattered in the lungs bilaterally, unchanged from the prior, favoring chronic atypical mycobacterial infection.   No suspicious pulmonary nodules.     Electronically Signed   By: Pinkie Pebbles M.D.   On: 07/11/2019 10:59      OV 07/11/2021  Subjective:  Patient ID: [place], female , DOB: Jul 24, 1966 , age 49 y.o. , MRN: [id number] , ADDRESS: [address] [place] [address] PCP Signa Rush, MD Patient Care Team: Signa Rush, MD as PCP - General (Internal Medicine)  This Provider for this visit: Treatment Team:  Attending Provider: Geronimo Amel, MD    07/11/2021 -   Chief Complaint  Patient presents with   Follow-up    Pt is here to discuss results of recent CT.  Pt states she has been doing okay since last visit.     HPI [name] 58 y.o. -here to follow-up for her right upper lobe micronodular infiltrates that is suggestive of MAC.  She also has had intermittent clearing of the throat and intermittently exams that it showed fluctuating wheezing.  She gives  a detailed chronnology of her lung issue   ? June 2016 - Pneumonia- upper right lung. - Don't remember being sick. Was visiting [place], [place] and developed sharp pain upper right lung. Pneumonia cleared up. (Noticed a  small ground glass nodule on lower left lobe. Radiologist said prob fine, but follow up later if concerned).  ? March 2020 - Followed up on ground glass nodule (I was concerned about what Covid would do). CT scan at [facility] on 11/27/18. NO change in nodule, but had small clusters in both lungs. Mild asymptomatic microbial infection possible. Called Dr. Geronimo [facility]) and he looked at scan. Said to wait and rescan in 6 months.  ? October 2020- Follow up CT scan. Still seeing small white clusters in both lungs. Possibility of MAC but hard to determine. (one radiologist thought MAC, another said probably not and Zell Perone (Radiologist friend) said that is the most likely explanation. I had a wheeze at the time too so did a breathing test. (It was normal).  ?? December 2020 - I clear my throat a lot so I was referred to an ENT - Dr. Garnette Silvan at [facility]. Dr. Silvan put tube down nose and said everything looked normal. He said he saw lots of saliva  in the back of my throat which is not normal. (I was numbed and when he was waiting for it to work, I could not feel myself swallow so that could be why there was excess saliva). He recommended Barium Swallow test to see if any issues swallowing.  ? January 2021 - Barium Swallow test at [facility]. All normal. A little slower with graham cracker, but went down fine when I took a sip of water.     Sept 2021 - [place] trip. No issue  ? May - July 2022 - Sick with fever, chills headache etc. felt like flu or bad cold.  I haven't had any issues for 2 years, until I got a cold/virus in May. ? May 6 th tested for Covid/Flu/RSV - all negative (Did a video call with PA. She said can go on docycyclin. I took it for 2 days. Dr. Signa said it probably wouldn't help because it's a virus. I stopped taking. (Noticed a little rash too) ? May 17 th - Saw Dr. Signa - told him I was coughing all the time and my upper lungs hurt. I was worried it was  pneumonia. He listened and said they were clear. Said I should feel better by end of weekend. ? June 6 th - Called Doctor again - still coughing. Gave me prescription for Z pack. Cough went away, cough went away and I felt better. ? June 13-27 th - felt fine- no cough. Even went for a few runs. No problem breathing at all. ? June 28 th - Felt sharp pain in upper left lung. I was helping my daughter move into her new house in [place] - painted (latex only- no strong fumes). I started coughing a little bit over weekend. (Coincidentally, I had 1 st pneumonia in [place] in 2016- something in the air??) ? June 30 th - Chest x ray confirmed pneumonia.  [Dr. Geronimo does not have this x-ray for visualization on this visit 07/11/2021].  Took 10 days of levofloxacin. No pain after 5 th day of antibiotic. Finished prescription on July 9 th . No phlegm or cough at all. ? July 12 - Watching - a little post nasal drip. Used Flonase   ? August 30 th week - set up CT scan and follow up with Dr. Geronimo. Want to check pneumonia clearing up and MAC results.    May 23, 2021: Added had COVID-19.  She did not take any antiviral.  She just had a mild cold and a mild cough.  And then felt well and recovered fast.  She wanted to know if she should take the COVID by Valent mRNA booster.     Curently:  -She is feeling great.  She walks around 3.5 miles per day.  No shortness of breath.  No cough no chest pains.  She is able to play pickle ball without any problems.  But occasionally she will intermittently clear her throat and occasionally have wheezing.  She feels this is related to the allergy season and it comes and goes but it is very mild.  -She is also concerned about a family history.  Her mom died from lung cancer and so did her maternal aunt.  Both her maternal grandparents also died from lung cancer.  She says all of them smoked heavily.  She has never smoked.    -She is worried  about the emerging respiratory virus season's.  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  I personally visualized this.  Also short of the images.  Radiologist reporting the right upper lobe anterior segment infiltrates to be slightly worse compared to a few years ago.  She is surprised with this she does not feel this.  In my personal visualization opinion I do not think it is any worse compared to 2 years ago.  We decided to get a thoracic radiology opinion on this.  CT Chest data 06/29/2021  CLINICAL DATA:  Follow-up pneumonia.   EXAM: CT CHEST WITHOUT CONTRAST   TECHNIQUE: Multidetector CT imaging of the chest was performed following the standard protocol without IV contrast.   COMPARISON:  07/11/2019   FINDINGS: Cardiovascular:  No acute findings.   Mediastinum/Nodes: No masses or pathologically enlarged lymph nodes identified on this unenhanced exam.   Lungs/Pleura: Stable biapical pleural-parenchymal scarring. Clustered areas of peribronchovascular nodularity are again seen in the posterior right upper lobe, lateral right middle lobe, and anterior right lower lobe. This shows mild progression in the anterior right lower lobe since prior study. No evidence of pulmonary airspace disease or pleural effusion.   Upper Abdomen:  Stable small fluid attenuation hepatic cysts.   Musculoskeletal:  No suspicious bone lesions.   IMPRESSION: Clustered areas of peribronchovascular nodularity in the right lung are again seen, with mild progression noted in the anterior right lower lobe. 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   No evidence of mass or lymphadenopathy.     Electronically Signed   By: Norleen DELENA Kil M.D.   On: 07/01/2021 13:36  2nd opinion from Dr Zell Better to patient:  I just reviewed your study from 10/13 and it's minimally changed from 07/11/19.  A little worse in the  right upper lobe, better in the right middle lobe. Was read by a body imager who is good, and I agree with his report.  No results found.    OV 08/29/2021  Subjective:  Patient ID: [place], female , DOB: 1966/03/18 , age 32 y.o. , MRN: [id number] , ADDRESS: [address] [place] [address] PCP Signa Norleen, MD Patient Care Team: Signa Norleen, MD as PCP - General (Internal Medicine)  This Provider for this visit: Treatment Team:  Attending Provider: Geronimo Amel, MD    08/29/2021 -   Chief Complaint  Patient presents with   Follow-up    PFT performed today. Pt states she has been doing good since last visit and denies any complaints.   -here to follow-up for her right upper lobe micronodular infiltrates that is suggestive of MAC.  She also has had intermittent clearing of the throat and intermittently exams that it showed fluctuating wheezing  Hx outpatinet COVID - sept 2022  HPI Carrera Kiesel 58 y.o. -returns for follow-up to discuss his symptoms and also investigation..  Results of investigation showed her autoimmune profile is negative.  Immunoglobulin level is normal.  QuantiFERON gold is normal.  However has significant positive allergens on the RAST blood allergy testing.  For some reason I do not see a CBC with differential for her eosinophil count.  She is agreed to get this done today.  In terms of the lung nodule: Mamie   took a second opinion on the CT from another radiologist.  It appears there is fluctuance to the nodules with some words and some better.  She had lung function test today and it is normal.  She is  beginning to feel better.  Her symptoms are very minimal.  She says she is a lot better.  Her clearing of the throat is very rare and is limited to early in the morning and occasionally phlegm.  She is puzzled by her recurrent pneumonia.  We did discuss the fact that 1 time it was following exposure to mold when  she was in [place].  Did express the fact that given her significant positivity for allergy testing that she might be extra sensitive to respiratory viruses and taking time to recover from it.  She believes she could be having seasonal allergy issues.  At this time she wants to manage with an expectant approach but clearly if she is getting worse or her symptoms are recurring then she will take the help of an allergist.  In terms of her pulmonary nodules: This is fluctuant/essentially stable.  Pulmonary function test is normal.  Symptoms are better.  Therefore we discussed option of doing bronchoscopy now versus following along.  We took a shared decision making to take an expectant approach.  We will do a pulmonary function test in 6 months and reassess.  She is agreeable to this approach.  In terms of the nodules: We discussed the possibility of radiation exposure with frequent CT scans.  Agree to hold off doing more aggressive testing would reassess for an annual CT scan in the summer 2023.     OV 03/11/2024  Subjective:  Patient ID: [place], female , DOB: [birth year] , age 13 y.o. , MRN: [id number] , ADDRESS: [address] [place] [address] PCP Dwight Trula SQUIBB, MD Patient Care Team: Dwight Trula SQUIBB, MD as PCP - General (Internal Medicine)  This Provider for this visit: Treatment Team:  Attending Provider: Geronimo Amel, MD    03/11/2024 -   Chief Complaint  Patient presents with   Follow-up    Here to discuss CT Chest. No respiratory co's.      HPI Laveah Gl[place] 58 y.o. -is back for follow-up.  I last saw her almost 2 years ago.  She is here because  #She had a cardiac CT and this showed increase in nodular density on the right side.  I personally visualized this and confirmed this.  She is really worried about lung cancer risk because her mother, and grandparents all passed from lung cancer although all the heavy smokers but she  is not herself a smoker.  I personally visualized the CT scan but also previous to the visit discussed with Dr. Newell Eke who felt it is more consistent with MAI and not consistent with lung cancer and felt very reassured.  [facility] radiologist recommended a 26-month follow-up.  Discussed with the patient and we took a shared decision making to get this in the next few to several weeks.  Will also check a QuantiFERON gold.  We talked about various scenarios with the nodule.  The nodule is enlarging and is biopsy amenable then she may need a biopsy.  However the nodule is same and it is not biopsy amenable to might have to get a bronchoscopy with lavage to rule out MAI.  We discussed about MRI.  If the nodule is regressed then it is just monitoring.  #Chronic throat clearing and history of wheezing: -> Currently she is completely asymptomatic.  She says that she is working out.  She walks 3 miles a day.  She lifts weights.  She has no problems she recently completed a 60 mile  walk of [place] in [place] and did not have any problems.  She does notice that when exposed to pollen or humid conditions or her respiratory viruses she tends to wheeze and also have throat clearing and postnasal drip and get worse but otherwise is doing well without any postnasal drip.  For instance in [place] she was doing excellent.  3 years ago she did have positive RAST allergy test for various environmental allergens.  We held off on allergy evaluation.  Brought up the possibility this could actually be mild hyperactive airways in response to viruses and other environmental agents.  Discussed the possibility of taking Valaria which would be a rescue albuterol with inhaled budesonide taken in risky situations she is open to the idea and we gave her a coupon.  In addition she will also get a CBC with differential        Dr Felice Reflux Symptom Index (> 13-15 suggestive of LPR cough) 0 -> 5  =  none ->severe  problem 08/29/2021   Hoarseness of problem with voice 0  Clearing  Of Throat 1  Excess throat mucus or feeling of post nasal drip 0.5  Difficulty swallowing food, liquid or tablets 0  Cough after eating or lying down 0  Breathing difficulties or choking episodes 0  Troublesome or annoying cough 0  Sensation of something sticking in throat or lump in throat 00  Heartburn, chest pain, indigestion, or stomach acid coming up 0  TOTAL 1.5      Latest Reference Range & Units 06/16/14 14:00 08/29/21 10:34  Eosinophils Absolute 0.0 - 0.7 K/uL 0.0 0.1       Latest Reference Range & Units 07/11/21 11:12  Allergen, A. alternata, m6 kU/L 0.10 (H)  Allergen, P. notatum, m1 kU/L <0.10  CLADOSPORIUM HERBARUM (M2) IGE kU/L 0.13 (H)  (H): Data is abnormally high   Latest Reference Range & Units 07/11/21 11:12  IgG (Immunoglobin G), Serum 600 - 1,640 mg/dL 275  IgM, Serum 50 - 699 mg/dL 897  Sheep Sorrel IgE kU/L 0.11 (H)  Pecan/Hickory Tree IgE kU/L 0.11 (H)  IgE (Immunoglobulin E), Serum <OR=114 kU/L <OR=114 kU/L 14 11  Allergen, D pternoyssinus,d7 kU/L 0.18 (H)  Cat Dander kU/L 0.11 (H)  Dog Dander kU/L 0.13 (H)  [place] Grass kU/L 0.15 (H)  Johnson Grass kU/L <0.10  Timothy Grass kU/L 0.12 (H)  Cockroach kU/L 0.19 (H)  Aspergillus fumigatus, m3 kU/L <0.10  Allergen, Comm Silver Valrie, t9 kU/L 0.27 (H)  Allergen, Cottonwood, t14 kU/L <0.10  Elm IgE kU/L 0.12 (H)  Allergen, Mulberry, t76 kU/L 0.20 (H)  Allergen, Oak,t7 kU/L 0.16 (H)  COMMON RAGWEED (SHORT) (W1) IGE kU/L 0.39 (H)  Allergen, Mouse Urine Protein, e78 kU/L 0.11 (H)  D. farinae kU/L 0.16 (H)  Allergen, Cedar tree, t12 kU/L 1.29 (H)  Box Elder IgE kU/L 0.13 (H)  Rough Pigweed  IgE kU/L <0.10  (H): Data is abnormally high   [id number]   Latest Reference Range & Units 07/11/21 11:12  Anti Nuclear Antibody (ANA) NEGATIVE  NEGATIVE  Cyclic Citrullin Peptide Ab UNITS <16  ds DNA Ab IU/mL 2   Immunoglobulin A 47 - 310 mg/dL 99  RA Latex Turbid. <14 IU/mL <14  IgG (Immunoglobin G), Serum 600 - 1,640 mg/dL 275  IgE (Immunoglobulin E), Serum <OR=114 kU/L <OR=114 kU/L 14 11  SSA (Ro) (ENA) Antibody, IgG <1.0 NEG AI <1.0 NEG  SSB (La) (ENA) Antibody, IgG <1.0 NEG AI <1.0 NEG  CT Chest data  No results found.   Latest Reference Range & Units 07/11/21 11:12  IgG (Immunoglobin G), Serum 600 - 1,640 mg/dL 275  IgE (Immunoglobulin E), Serum <OR=114 kU/L <OR=114 kU/L 14 11     PFT     Latest Ref Rng & Units 08/29/2021    9:04 AM 07/25/2019    3:53 PM  PFT Results  FVC-Pre L 3.05  3.07   FVC-Predicted Pre % 93  90   FVC-Post L  3.16   FVC-Predicted Post %  93   Pre FEV1/FVC % % 78  80   Post FEV1/FCV % %  82   FEV1-Pre L 2.38  2.47   FEV1-Predicted Pre % 93  92   FEV1-Post L  2.60   DLCO uncorrected ml/min/mmHg 24.12  25.72   DLCO UNC% % 121  125   DLCO corrected ml/min/mmHg 24.12    DLCO COR %Predicted % 121    DLVA Predicted % 116  124   TLC L  5.74   TLC % Predicted %  114   RV % Predicted %  141        LAB RESULTS last 96 hours No results found.       has a past medical history of Dental crowns present and Papilloma of left breast (05/2014).   reports that she has never smoked. She has never used smokeless tobacco.  Past Surgical History:  Procedure Laterality Date   BREAST EXCISIONAL BIOPSY Left 2015   COMBINED HYSTEROSCOPY DIAGNOSTIC / D&C  06/29/2005   with removal of endometrial polyps   ENDOMETRIAL ABLATION W/ NOVASURE     GYNECOLOGIC CRYOSURGERY  1989    Allergies  Allergen Reactions   Etodolac Rash   Penicillins Rash    Immunization History  Administered Date(s) Administered   Influenza,inj,Quad PF,6+ Mos 10/11/2015, 07/14/2019, 07/11/2021   Influenza,inj,quad, With Preservative 09/19/2015   PFIZER(Purple Top)SARS-COV-2 Vaccination 11/07/2019, 12/15/2019, 08/18/2020   Pfizer(Comirnaty)Fall Seasonal Vaccine 12 years and  older 07/04/2022    Family History  Problem Relation Age of Onset   Cancer Maternal Aunt    Breast cancer Maternal Aunt    Cancer Maternal Grandmother    Cancer Maternal Grandfather    Cancer Paternal Grandmother    Breast cancer Paternal Aunt      Current Outpatient Medications:    azithromycin  (ZITHROMAX ) 250 MG tablet, Take 1 tablet (250 mg total) by mouth daily. (Patient not taking: Reported on 03/11/2024), Disp: 7 tablet, Rfl: 0      Objective:   Vitals:   03/11/24 0941 03/11/24 0943  BP:  106/60  Pulse: 80   SpO2: 98%   Weight:  116 lb (52.6 kg)  Height:  5' 3 (1.6 m)    Estimated body mass index is 20.55 kg/m as calculated from the following:   Height as of this encounter: 5' 3 (1.6 m).   Weight as of this encounter: 116 lb (52.6 kg).  @WEIGHTCHANGE @  [employer]   03/11/24 0943  Weight: 116 lb (52.6 kg)     Physical Exam   General: No distress. Looks well O2 at rest: no Cane present: no Sitting in wheel chair: no Frail: no Obese: no Neuro: Alert and Oriented x 3. GCS 15. Speech normal Psych: Pleasant Resp:  Barrel Chest - no.  Wheeze - no, Crackles - no, No overt respiratory distress CVS: Normal heart sounds. Murmurs - no Ext: Stigmata of Connective Tissue Disease - no HEENT: Normal upper airway. PEERL +.  No post nasal drip        Assessment:       ICD-10-CM   1. Pulmonary nodules  R91.8 CT Chest Wo Contrast    CBC w/Diff    QuantiFERON-TB Gold Plus    QuantiFERON-TB Gold Plus    CBC w/Diff    2. Family history of lung cancer  Z80.1     3. Chronic throat clearing  R09.89 CT Chest Wo Contrast    CBC w/Diff    QuantiFERON-TB Gold Plus    QuantiFERON-TB Gold Plus    CBC w/Diff    4. 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)  T78.49XA CT Chest Wo Contrast    CBC w/Diff    QuantiFERON-TB Gold Plus    QuantiFERON-TB Gold Plus    CBC w/Diff         Plan:     Patient Instructions     ICD-10-CM   1. Pulmonary nodules  R91.8      2. Chronic throat clearing  R09.89     3. 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)  T78.49XA         Pulmonary nodules FAmily histoyr of lung cancer  - mother, aunt and grandparents all passed from lung cancer (ALL were heavy smokers)  - There is increased nodularity in the nodules in the right lung in March 2025 compared to 2022 n   Plan  -  CT chest without contrast in next few to several weeks -as a follow-up - recheck Quantitferon gold 03/11/2024 - check cbc with diff 03/11/2024  Chronic throat clearing History of wheezing Environmental allergies -RAST panel positive in 2022   - I think you could be suffering from mild intermittent asthma based on your allergy profile and your history of getting sinus drainage and respiratory exacerbations particularly following viral infections or pollen exposure  Plan  - - do cbc with diff 03/11/2024 - Do AIR SUPRA prn  - TAKE COUpON - expectant  approach  - allergy referral if needed based on course - avoid all respiratory infections by masking in crowds, clusters, and avoiding sick contacts'   Followup - few weeks email/call me for CT results - to decide next step - 6 month routine followu    FOLLOWUP Return in about 6 months (around 09/10/2024) for 15 min visit, with Dr Geronimo, Face to Face Visit.  ( Level 05 visit E&M 2024: Estb >= 40 min   visit type: on-site physical face to visit  in total care time and counseling or/and coordination of care by this undersigned MD - Dr Dorethia Geronimo. This includes one or more of the following on this same day 03/11/2024: pre-charting, chart review, note writing, documentation discussion of test results, diagnostic or treatment recommendations, prognosis, risks and benefits of management options, instructions, education, compliance or risk-factor reduction. It excludes time spent by the CMA or office staff in the care of the patient. Actual time 40 min)   SIGNATURE    Dr. Dorethia Geronimo, M.D., F.C.C.P,  Pulmonary and Critical Care Medicine Staff Physician, [facility] Center Director - Interstitial Lung Disease  Program  Pulmonary Fibrosis [facility] at [facility] [place], [place], [address]  Pager: [phone], If no answer or between  15:00h - 7:00h: call [phone] Telephone: [phone]  2:53 PM 03/11/2024

## 2024-03-13 ENCOUNTER — Telehealth (HOSPITAL_BASED_OUTPATIENT_CLINIC_OR_DEPARTMENT_OTHER): Payer: Self-pay

## 2024-03-13 ENCOUNTER — Other Ambulatory Visit (HOSPITAL_BASED_OUTPATIENT_CLINIC_OR_DEPARTMENT_OTHER): Payer: Self-pay

## 2024-03-13 LAB — QUANTIFERON-TB GOLD PLUS
Mitogen-NIL: 9.46 [IU]/mL
NIL: 0.02 [IU]/mL
QuantiFERON-TB Gold Plus: NEGATIVE
TB1-NIL: 0 [IU]/mL
TB2-NIL: 0 [IU]/mL

## 2024-03-13 MED ORDER — AIRSUPRA 90-80 MCG/ACT IN AERO: 2.0000 | INHALATION_SPRAY | Freq: Four times a day (QID) | RESPIRATORY_TRACT | 3 refills | Status: AC | PRN

## 2024-03-13 NOTE — Telephone Encounter (Signed)
 Copied from CRM [id number]. Topic: Clinical - Prescription Issue >> Mar 12, 2024  9:08 AM Joesph PARAS wrote: Reason for CRM: Patient states went to pharmacy to get her Air Supra but that they do not have the rx for it yet. Patient requesting this be sent so she can pick it up. States will [facility] [address] [facility] [place]. >> Mar 13, 2024  3:21 PM Isabell A wrote: Patient calling to check on status of her prescription being sent to the pharmacy - advised it has not been sent yet.

## 2024-03-15 ENCOUNTER — Ambulatory Visit: Payer: Self-pay | Admitting: Internal Medicine

## 2024-03-15 DIAGNOSIS — J40 Bronchitis, not specified as acute or chronic: Secondary | ICD-10-CM

## 2024-03-15 DIAGNOSIS — R918 Other nonspecific abnormal finding of lung field: Secondary | ICD-10-CM

## 2024-03-15 NOTE — Progress Notes (Signed)
 Bloood work normal

## 2024-04-03 ENCOUNTER — Other Ambulatory Visit

## 2024-04-10 ENCOUNTER — Ambulatory Visit
Admission: RE | Admit: 2024-04-10 | Discharge: 2024-04-10 | Disposition: A | Discharge status: Home / Self Care | Source: Ambulatory Visit | Attending: Internal Medicine | Admitting: Internal Medicine

## 2024-04-10 DIAGNOSIS — R918 Other nonspecific abnormal finding of lung field: Secondary | ICD-10-CM

## 2024-04-10 DIAGNOSIS — R0989 Other specified symptoms and signs involving the circulatory and respiratory systems: Secondary | ICD-10-CM

## 2024-04-10 DIAGNOSIS — T7849XA Other allergy, initial encounter: Secondary | ICD-10-CM

## 2024-04-15 ENCOUNTER — Other Ambulatory Visit: Payer: Self-pay | Admitting: Obstetrics and Gynecology

## 2024-04-15 DIAGNOSIS — Z1231 Encounter for screening mammogram for malignant neoplasm of breast: Secondary | ICD-10-CM

## 2024-05-25 NOTE — Addendum Note (Signed)
 Addended by: GERONIMO AMEL on: 05/25/2024 08:58 PM   Modules accepted: Orders

## 2024-05-25 NOTE — Telephone Encounter (Signed)
 Rob  Let me know aht you think of Kelsey Greer nodules   Clinical POOL  Please refer Kelsey Greer to [facility] Dr Gorman ([email or web address]) for bronchiectasis and nodules and recurrent bronchitis. Order placed  Thanks    SIGNATURE    Dr. Dorethia Cave, M.D., F.C.C.P,  Pulmonary and Critical Care Medicine Staff Physician, [facility] Center Director - Interstitial Lung Disease  Program  Pulmonary Fibrosis [facility] at [facility] [place], [place], [address]   Pager: [phone], If no answer  -> Check AMION or Try [phone] Telephone (clinical office): [phone] Telephone (research): [phone]  8:57 PM 05/25/2024

## 2024-05-27 NOTE — Telephone Encounter (Signed)
 Discussed with Dr Geronimo.

## 2024-05-29 ENCOUNTER — Telehealth: Payer: Self-pay | Admitting: Internal Medicine

## 2024-05-29 NOTE — Telephone Encounter (Signed)
 Copied from CRM [id number]. Topic: General - Other >> May 29, 2024  1:04 PM Rilla B wrote: Patient states she saw Dr Geronimo and he was to send a referral to Dr Anette Arlean Kipper in [facility]. Patient call and they have not received referral although Dr Geronimo did email patient on Sunday and stated the referral was placed. They would also like imagine and notes.  Please fax to: [phone].

## 2024-06-03 NOTE — Telephone Encounter (Signed)
 Patient's information has been faxed over to Dr. Blondie office. NFN

## 2024-06-20 ENCOUNTER — Ambulatory Visit

## 2024-07-02 ENCOUNTER — Ambulatory Visit
Admission: RE | Admit: 2024-07-02 | Discharge: 2024-07-02 | Disposition: A | Discharge status: Home / Self Care | Source: Ambulatory Visit | Attending: Obstetrics and Gynecology | Admitting: Obstetrics and Gynecology

## 2024-07-02 DIAGNOSIS — Z1231 Encounter for screening mammogram for malignant neoplasm of breast: Secondary | ICD-10-CM

## 2024-08-21 ENCOUNTER — Ambulatory Visit: Admitting: Internal Medicine

## 2024-08-28 ENCOUNTER — Encounter: Payer: Self-pay | Admitting: Internal Medicine

## 2024-11-20 ENCOUNTER — Ambulatory Visit: Admitting: Internal Medicine
